# Patient Record
Sex: Female | Born: 1970 | Race: White | Hispanic: No | Marital: Married | Smoking: Never smoker
Health system: Southern US, Community
[De-identification: ages and names within clinical notes are randomized; demographics above are authoritative.]

## PROBLEM LIST (undated history)

## (undated) DIAGNOSIS — M5412 Radiculopathy, cervical region: Secondary | ICD-10-CM

## (undated) DIAGNOSIS — G2581 Restless legs syndrome: Secondary | ICD-10-CM

## (undated) DIAGNOSIS — R000 Tachycardia, unspecified: Secondary | ICD-10-CM

## (undated) DIAGNOSIS — F329 Major depressive disorder, single episode, unspecified: Secondary | ICD-10-CM

## (undated) DIAGNOSIS — F419 Anxiety disorder, unspecified: Secondary | ICD-10-CM

## (undated) DIAGNOSIS — G459 Transient cerebral ischemic attack, unspecified: Secondary | ICD-10-CM

## (undated) DIAGNOSIS — M545 Low back pain, unspecified: Secondary | ICD-10-CM

## (undated) DIAGNOSIS — M797 Fibromyalgia: Secondary | ICD-10-CM

## (undated) DIAGNOSIS — R5382 Chronic fatigue, unspecified: Secondary | ICD-10-CM

## (undated) DIAGNOSIS — I10 Essential (primary) hypertension: Secondary | ICD-10-CM

## (undated) DIAGNOSIS — F32A Depression, unspecified: Secondary | ICD-10-CM

## (undated) HISTORY — PX: TONSILLECTOMY: SUR1361

## (undated) HISTORY — DX: Low back pain, unspecified: M54.50

## (undated) HISTORY — DX: Chronic fatigue, unspecified: R53.82

## (undated) HISTORY — DX: Radiculopathy, cervical region: M54.12

## (undated) HISTORY — DX: Tachycardia, unspecified: R00.0

---

## 1999-12-25 HISTORY — PX: ABDOMINAL HYSTERECTOMY: SHX81

## 2004-01-25 ENCOUNTER — Other Ambulatory Visit: Admission: RE | Admit: 2004-01-25 | Discharge: 2004-01-25 | Payer: Self-pay | Admitting: Obstetrics and Gynecology

## 2008-08-02 ENCOUNTER — Encounter: Admission: RE | Admit: 2008-08-02 | Discharge: 2008-08-02 | Payer: Self-pay | Admitting: Gastroenterology

## 2008-09-05 ENCOUNTER — Emergency Department (HOSPITAL_COMMUNITY): Admission: EM | Admit: 2008-09-05 | Discharge: 2008-09-06 | Payer: Self-pay | Admitting: Emergency Medicine

## 2008-09-14 ENCOUNTER — Other Ambulatory Visit: Admission: RE | Admit: 2008-09-14 | Discharge: 2008-09-14 | Payer: Self-pay | Admitting: Family Medicine

## 2008-09-29 ENCOUNTER — Encounter: Admission: RE | Admit: 2008-09-29 | Discharge: 2008-09-29 | Payer: Self-pay | Admitting: Family Medicine

## 2009-08-01 ENCOUNTER — Encounter (HOSPITAL_COMMUNITY): Admission: RE | Admit: 2009-08-01 | Discharge: 2009-09-22 | Payer: Self-pay | Admitting: Family Medicine

## 2009-08-23 ENCOUNTER — Emergency Department (HOSPITAL_BASED_OUTPATIENT_CLINIC_OR_DEPARTMENT_OTHER): Admission: EM | Admit: 2009-08-23 | Discharge: 2009-08-24 | Payer: Self-pay | Admitting: Emergency Medicine

## 2009-08-24 ENCOUNTER — Ambulatory Visit: Payer: Self-pay | Admitting: Interventional Radiology

## 2010-01-09 ENCOUNTER — Encounter: Admission: RE | Admit: 2010-01-09 | Discharge: 2010-01-09 | Payer: Self-pay | Admitting: Family Medicine

## 2010-03-04 IMAGING — CT CT ABDOMEN W/ CM
2 of 5 series · 14 of 32 positions shown, 19 images · IV contrast (omniscan)
Comparison: Abdominal radiographs 09/06/2008

CT ABDOMEN

CLINICAL DATA: Abdominal pain and vomiting

CT ABDOMEN AND PELVIS WITH CONTRAST
TECHNIQUE: Multidetector CT imaging of the abdomen and pelvis was
performed using the standard protocol following bolus
administration of intravenous contrast.
Contrast: 100 ml Omniscan 300 IV contrast

[Series 2: routine abdomen · axial · 0.70mm/px · z∈[-431,-126]mm · 7 of 83 slices shown, 12 images]
[im 11/83  soft-tissue]
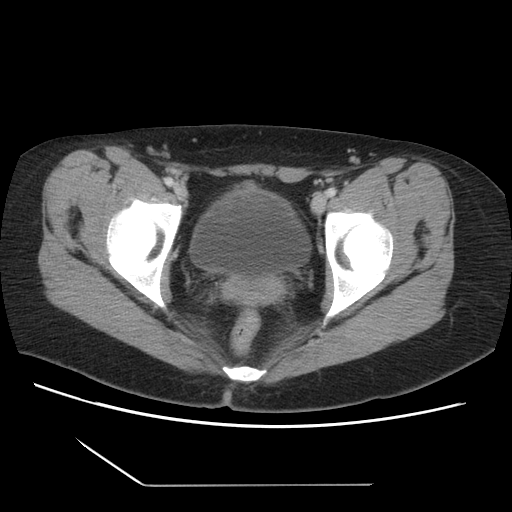
[im 11/83  bone]
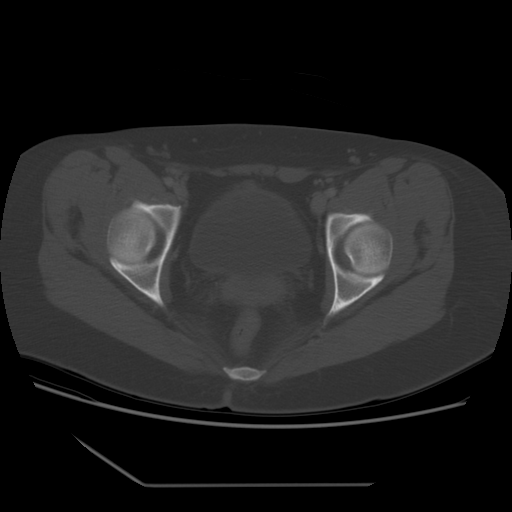
[im 21/83  soft-tissue]
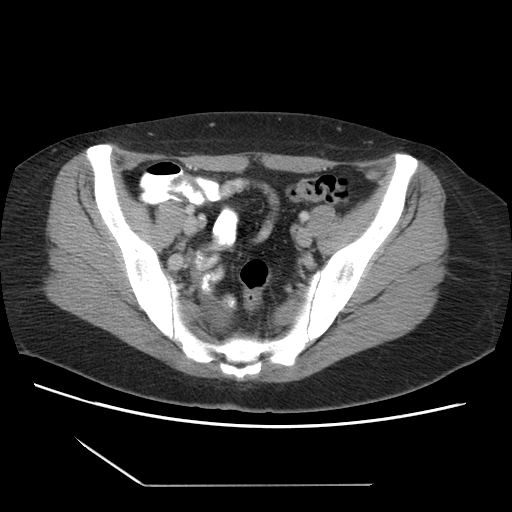
[im 31/83  soft-tissue]
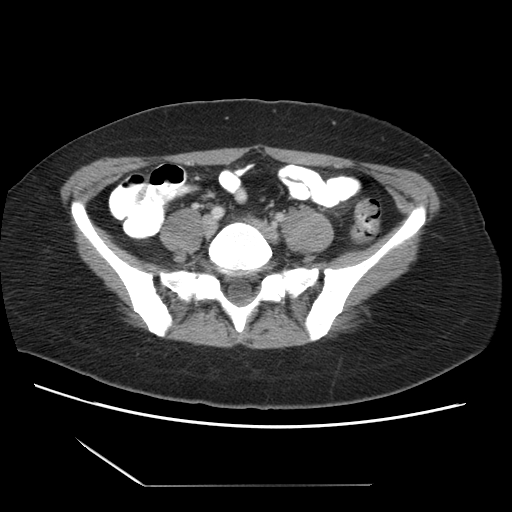
[im 42/83  soft-tissue]
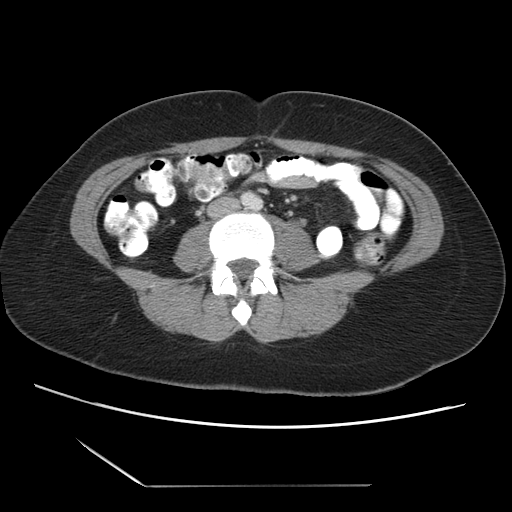
[im 42/83  lung]
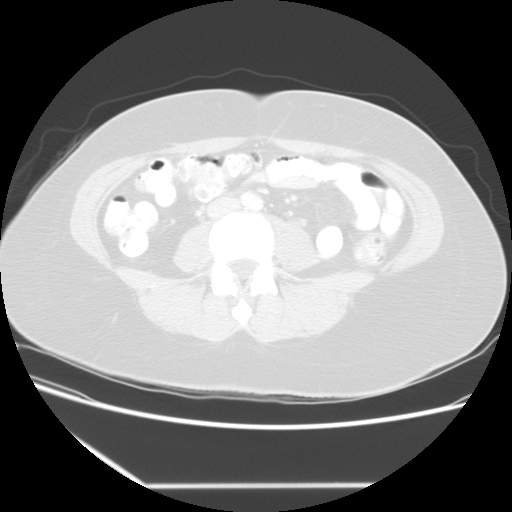
[im 52/83  soft-tissue]
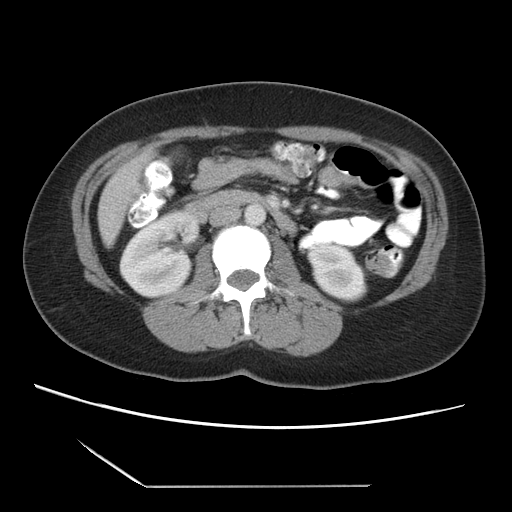
[im 52/83  lung]
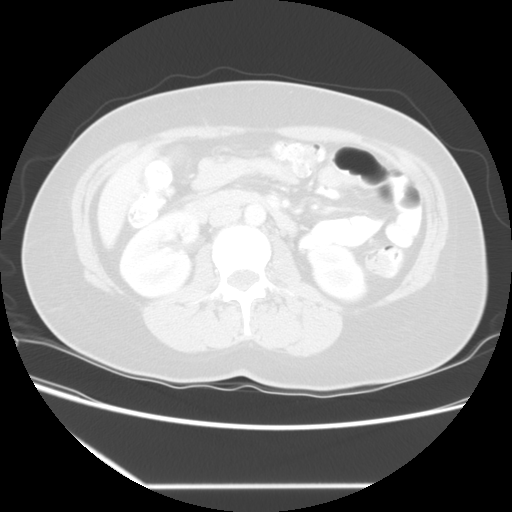
[im 62/83  soft-tissue]
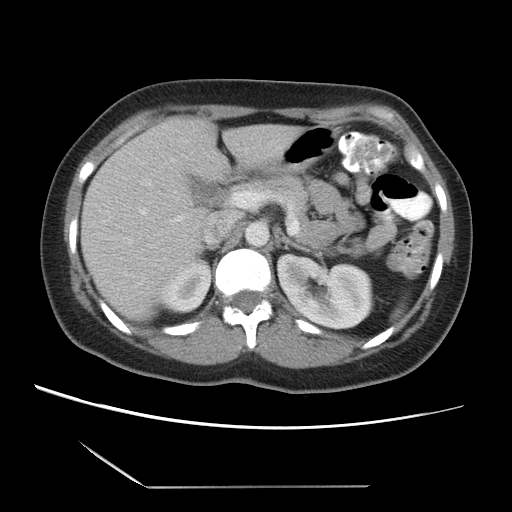
[im 62/83  lung]
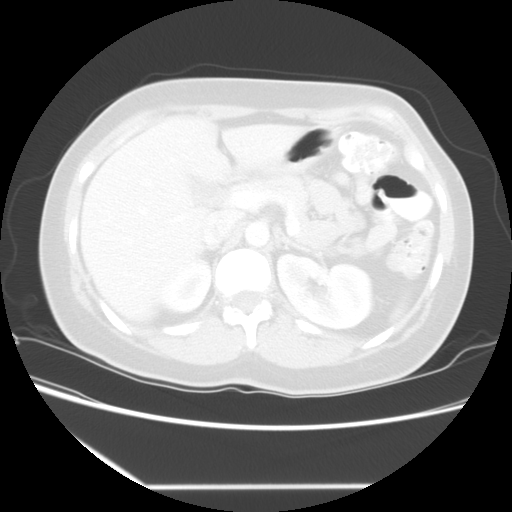
[im 72/83  soft-tissue]
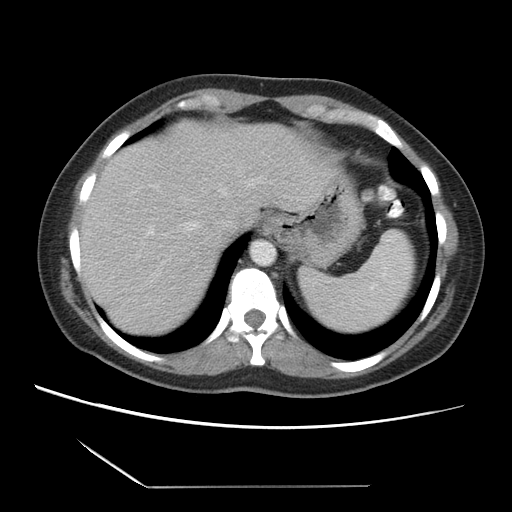
[im 72/83  lung]
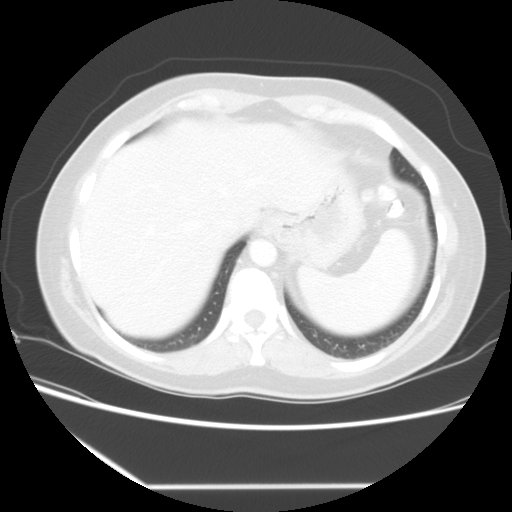

[Series 400: reformatted · sagittal · 0.88mm/px · 7 of 115 slices shown]
[im 12/115  soft-tissue]
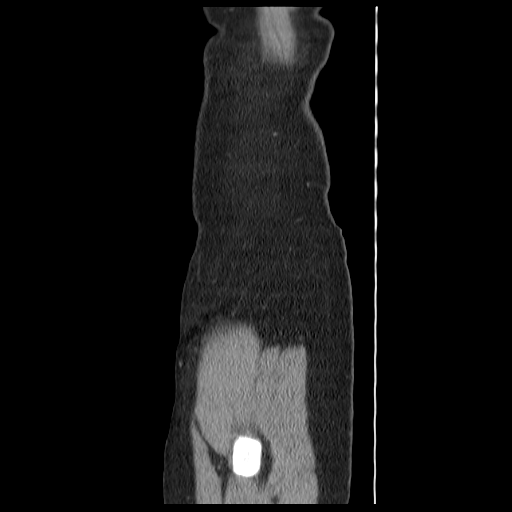
[im 23/115  soft-tissue]
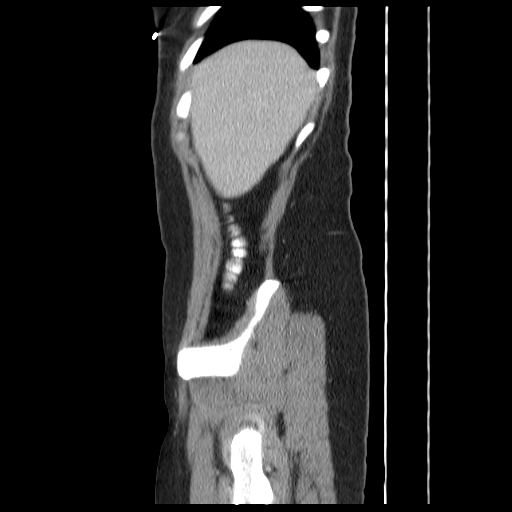
[im 35/115  soft-tissue]
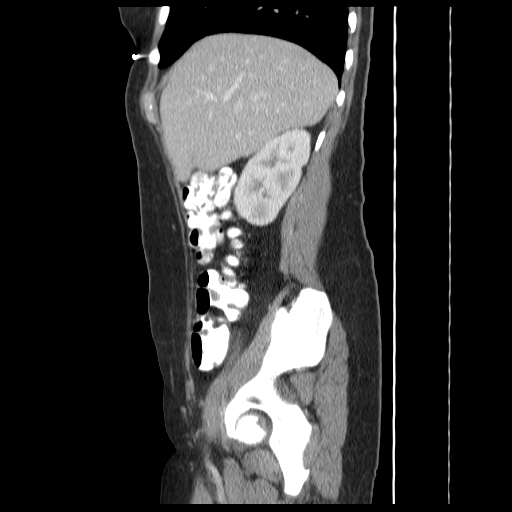
[im 46/115  soft-tissue]
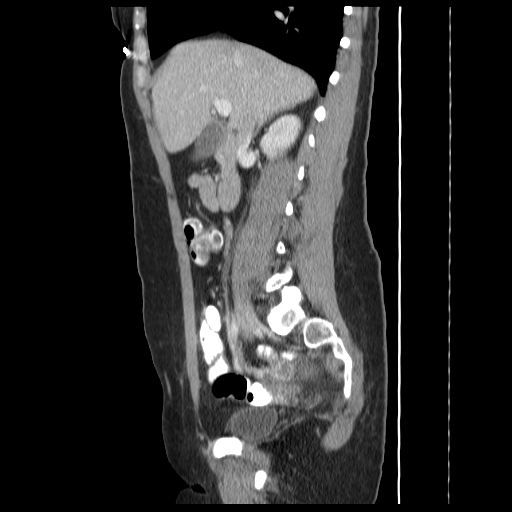
[im 69/115  soft-tissue]
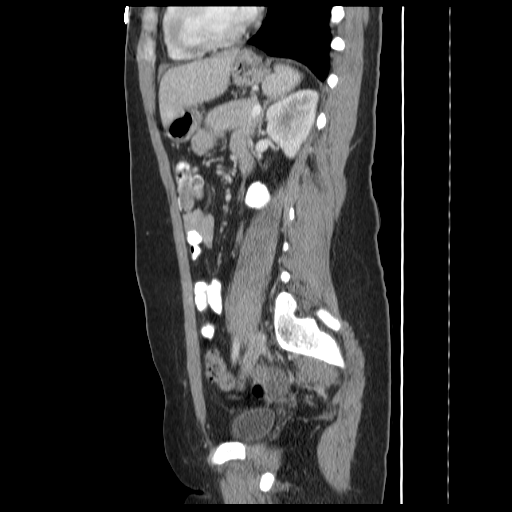
[im 80/115  soft-tissue]
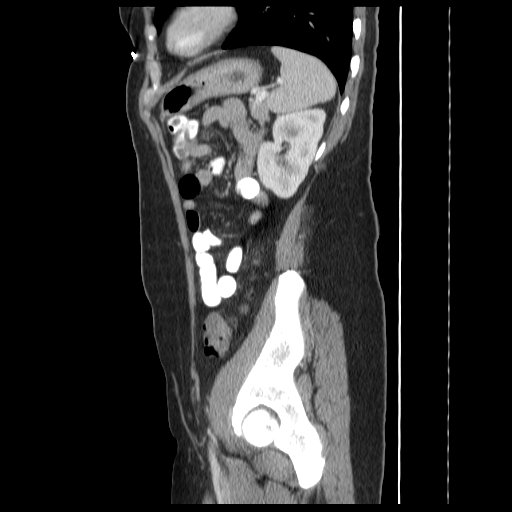
[im 92/115  soft-tissue]
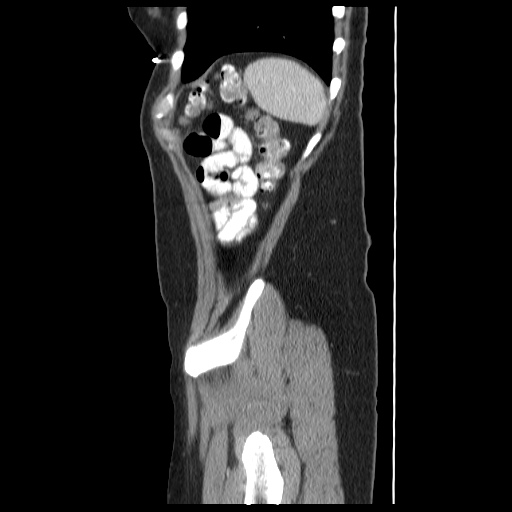

[14 of 32 positions shown; findings below may reference images not displayed]

FINDINGS: Lung bases are clear.  Abdominal viscera are normal.  No
free air or fluid.
IMPRESSION: No acute intra-abdominal finding.

CT PELVIS
FINDINGS: A small amount of low density free pelvic fluid is
identified.  The ovaries are unremarkable.  The uterus appears to
be surgically absent.  Some reflux of contrast into the right
gonadal vein is noted which may suggest pelvic congestion syndrome
in the appropriate clinical context.  Small and large bowel are
normal in appearance. No acute bony finding.
IMPRESSION: No acute intrapelvic finding.

## 2010-03-04 IMAGING — CR DG ABDOMEN ACUTE W/ 1V CHEST
3 series · 3 of 3 positions shown · non-contrast
Comparison: 08/02/2008

CLINICAL DATA: Pain.

ACUTE ABDOMEN SERIES (ABDOMEN 2 VIEW & CHEST 1 VIEW)

[w chest pa]
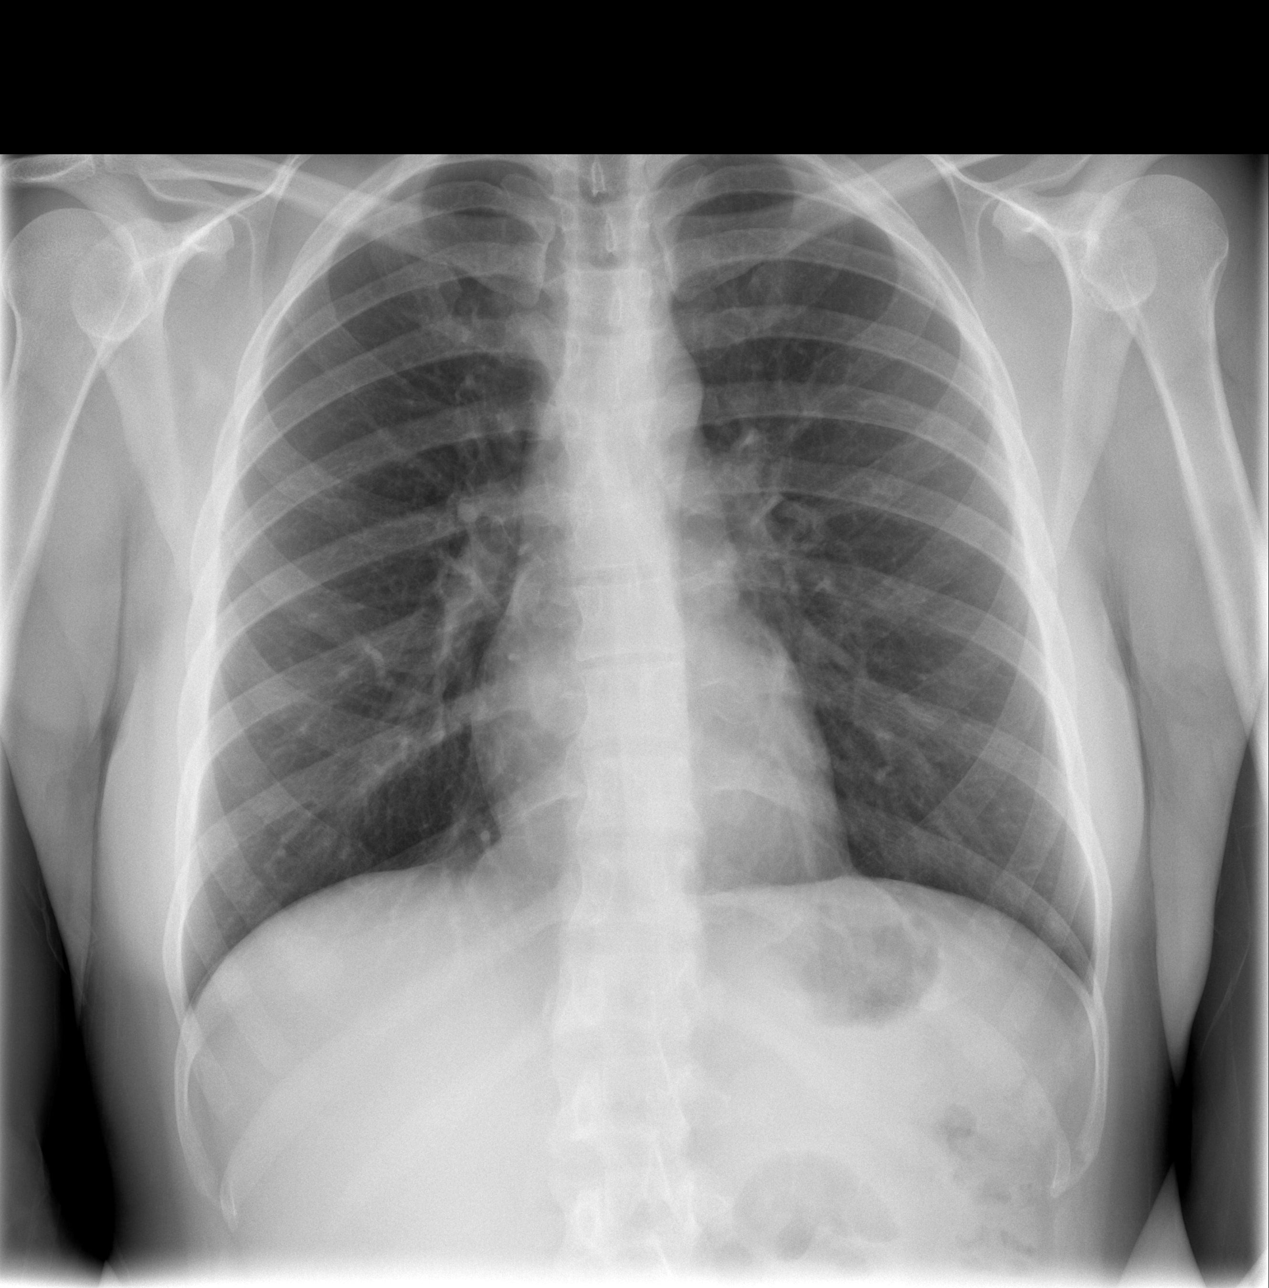

[w abdomen upright]
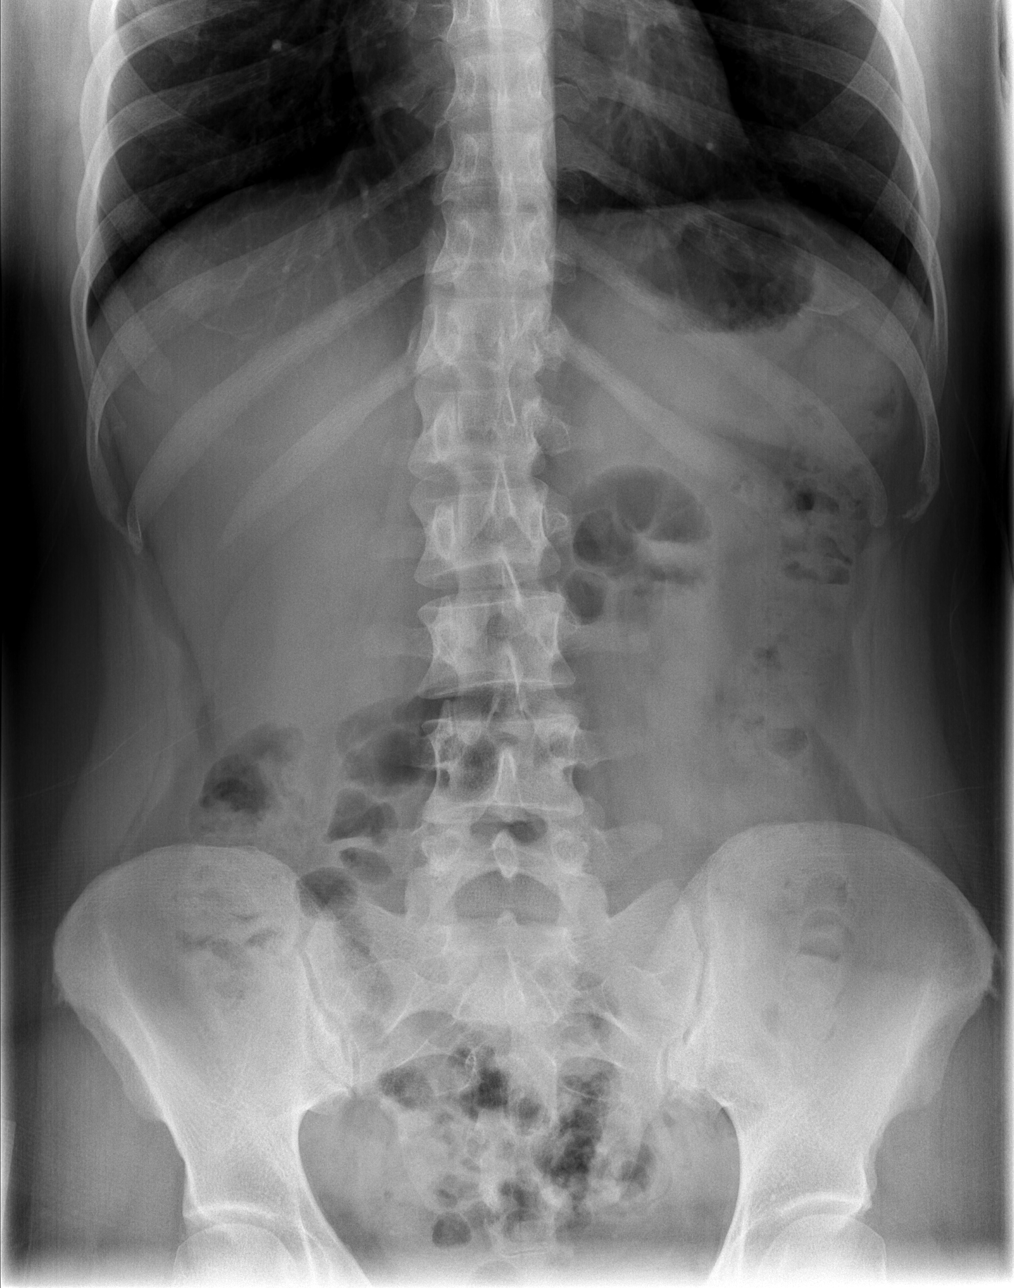

[t abdomen supine]
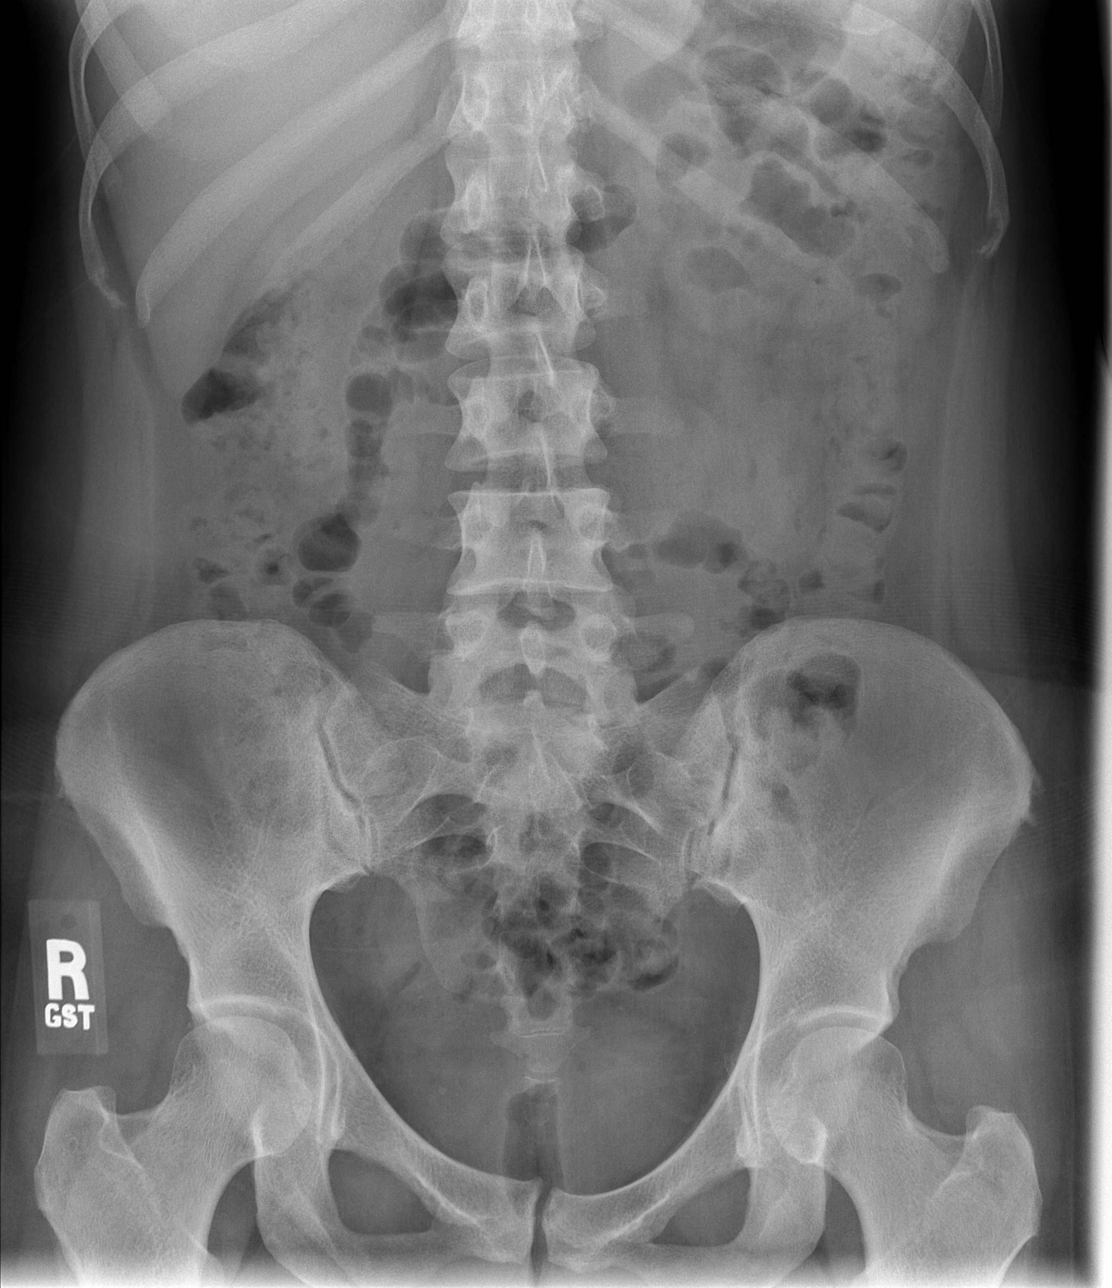

[3 of 3 positions shown; findings below may reference images not displayed]

FINDINGS: Heart size and mediastinal contours are unremarkable.

No pleural effusion or pulmonary interstitial edema.

No airspace densities identified.

The bowel gas pattern is nonobstructive.

 No dilated loops of small bowel or air-fluid levels noted.
IMPRESSION: 1.  Nonobstructive bowel gas pattern.

## 2011-03-30 LAB — POCT B-TYPE NATRIURETIC PEPTIDE (BNP): B Natriuretic Peptide, POC: <5 pg/mL (ref 0–100)

## 2011-03-31 LAB — URINALYSIS, ROUTINE W REFLEX MICROSCOPIC
Glucose, UA: NEGATIVE mg/dL
Ketones, ur: NEGATIVE mg/dL
Specific Gravity, Urine: 1.015 (ref 1.005–1.030)
pH: 6 (ref 5.0–8.0)

## 2011-03-31 LAB — COMPREHENSIVE METABOLIC PANEL
AST: 46 U/L — ABNORMAL HIGH (ref 0–37)
Albumin: 4.2 g/dL (ref 3.5–5.2)
CO2: 23 mEq/L (ref 19–32)
GFR calc Af Amer: >60 mL/min (ref >60)
Glucose, Bld: 88 mg/dL (ref 70–99)
Potassium: 4.5 mEq/L (ref 3.5–5.1)
Total Protein: 7.7 g/dL (ref 6.0–8.3)

## 2011-04-19 ENCOUNTER — Other Ambulatory Visit: Payer: Self-pay | Admitting: Family Medicine

## 2011-04-19 ENCOUNTER — Ambulatory Visit
Admission: RE | Admit: 2011-04-19 | Discharge: 2011-04-19 | Disposition: A | Discharge status: Home / Self Care | Payer: Managed Care, Other (non HMO) | Source: Ambulatory Visit | Attending: Family Medicine | Admitting: Family Medicine

## 2011-04-19 DIAGNOSIS — R51 Headache: Secondary | ICD-10-CM

## 2011-04-19 DIAGNOSIS — R479 Unspecified speech disturbances: Secondary | ICD-10-CM

## 2011-04-20 ENCOUNTER — Emergency Department (HOSPITAL_BASED_OUTPATIENT_CLINIC_OR_DEPARTMENT_OTHER)
Admission: EM | Admit: 2011-04-20 | Discharge: 2011-04-20 | Disposition: A | Discharge status: Nursing Home (Includes ICF) | Payer: Managed Care, Other (non HMO) | Attending: Emergency Medicine | Admitting: Emergency Medicine

## 2011-04-20 ENCOUNTER — Emergency Department (HOSPITAL_COMMUNITY): Payer: Managed Care, Other (non HMO)

## 2011-04-20 ENCOUNTER — Emergency Department (HOSPITAL_COMMUNITY)
Admission: EM | Admit: 2011-04-20 | Discharge: 2011-04-20 | Disposition: A | Discharge status: Home / Self Care | Payer: Managed Care, Other (non HMO) | Attending: Emergency Medicine | Admitting: Emergency Medicine

## 2011-04-20 DIAGNOSIS — G459 Transient cerebral ischemic attack, unspecified: Secondary | ICD-10-CM | POA: Insufficient documentation

## 2011-04-20 DIAGNOSIS — I10 Essential (primary) hypertension: Secondary | ICD-10-CM | POA: Insufficient documentation

## 2011-04-20 DIAGNOSIS — Z79899 Other long term (current) drug therapy: Secondary | ICD-10-CM | POA: Insufficient documentation

## 2011-04-20 DIAGNOSIS — R4789 Other speech disturbances: Secondary | ICD-10-CM | POA: Insufficient documentation

## 2011-04-20 LAB — LIPID PANEL
Cholesterol: 157 mg/dL (ref 0–200)
HDL: 71 mg/dL (ref >39)
Total CHOL/HDL Ratio: 2.2 RATIO

## 2011-04-20 LAB — URINALYSIS, ROUTINE W REFLEX MICROSCOPIC
Bilirubin Urine: NEGATIVE
Hgb urine dipstick: NEGATIVE
Ketones, ur: NEGATIVE mg/dL
Protein, ur: NEGATIVE mg/dL
Urobilinogen, UA: 0.2 mg/dL (ref 0.0–1.0)
pH: 6.5 (ref 5.0–8.0)

## 2011-04-20 LAB — BASIC METABOLIC PANEL
CO2: 28 mEq/L (ref 19–32)
Creatinine, Ser: 0.6 mg/dL (ref 0.4–1.2)
GFR calc non Af Amer: >60 mL/min (ref >60)
Glucose, Bld: 88 mg/dL (ref 70–99)

## 2011-04-20 LAB — CBC
HCT: 37 % (ref 36.0–46.0)
Platelets: 309 10*3/uL (ref 150–400)
RBC: 4.16 MIL/uL (ref 3.87–5.11)
RDW: 12.5 % (ref 11.5–15.5)

## 2011-04-20 LAB — TROPONIN I: Troponin I: 0.01 ng/mL (ref 0.00–0.06)

## 2011-04-20 LAB — COMPREHENSIVE METABOLIC PANEL
ALT: 18 U/L (ref 0–35)
AST: 20 U/L (ref 0–37)
Alkaline Phosphatase: 78 U/L (ref 39–117)
BUN: 8 mg/dL (ref 6–23)
CO2: 27 mEq/L (ref 19–32)
GFR calc non Af Amer: >60 mL/min (ref >60)
Glucose, Bld: 104 mg/dL — ABNORMAL HIGH (ref 70–99)
Potassium: 4 mEq/L (ref 3.5–5.1)
Total Bilirubin: 0.2 mg/dL — ABNORMAL LOW (ref 0.3–1.2)

## 2011-04-20 LAB — DIFFERENTIAL
Eosinophils Relative: 1 % (ref 0–5)
Monocytes Absolute: 0.7 10*3/uL (ref 0.1–1.0)
Monocytes Relative: 9 % (ref 3–12)
Neutro Abs: 4.9 10*3/uL (ref 1.7–7.7)

## 2011-04-20 LAB — POCT CARDIAC MARKERS: Myoglobin, poc: 41.6 ng/mL (ref 12–200)

## 2011-04-20 LAB — PREGNANCY, URINE: Preg Test, Ur: NEGATIVE

## 2011-04-20 LAB — HEMOGLOBIN A1C: Mean Plasma Glucose: 97 mg/dL (ref <117)

## 2011-04-20 LAB — PROTIME-INR: INR: 0.88 (ref 0.00–1.49)

## 2011-05-23 ENCOUNTER — Other Ambulatory Visit: Payer: Self-pay | Admitting: Family Medicine

## 2011-05-23 ENCOUNTER — Ambulatory Visit
Admission: RE | Admit: 2011-05-23 | Discharge: 2011-05-23 | Disposition: A | Discharge status: Home / Self Care | Payer: Managed Care, Other (non HMO) | Source: Ambulatory Visit | Attending: Family Medicine | Admitting: Family Medicine

## 2011-05-23 DIAGNOSIS — R0602 Shortness of breath: Secondary | ICD-10-CM

## 2011-05-23 MED ORDER — IOHEXOL 350 MG/ML SOLN
125.0000 mL | Freq: Once | INTRAVENOUS | Status: AC | PRN
  Administered 2011-05-23: 125 mL via INTRAVENOUS

## 2011-05-24 ENCOUNTER — Ambulatory Visit (HOSPITAL_COMMUNITY)
Admission: RE | Admit: 2011-05-24 | Discharge: 2011-05-24 | Disposition: A | Discharge status: Home / Self Care | Payer: Managed Care, Other (non HMO) | Source: Ambulatory Visit | Attending: Cardiology | Admitting: Cardiology

## 2011-05-24 DIAGNOSIS — G459 Transient cerebral ischemic attack, unspecified: Secondary | ICD-10-CM | POA: Insufficient documentation

## 2011-06-12 ENCOUNTER — Other Ambulatory Visit: Payer: Self-pay | Admitting: Family Medicine

## 2011-06-12 DIAGNOSIS — K219 Gastro-esophageal reflux disease without esophagitis: Secondary | ICD-10-CM

## 2011-06-19 ENCOUNTER — Ambulatory Visit
Admission: RE | Admit: 2011-06-19 | Discharge: 2011-06-19 | Disposition: A | Discharge status: Home / Self Care | Payer: Managed Care, Other (non HMO) | Source: Ambulatory Visit | Attending: Family Medicine | Admitting: Family Medicine

## 2011-06-19 DIAGNOSIS — K219 Gastro-esophageal reflux disease without esophagitis: Secondary | ICD-10-CM

## 2011-07-04 ENCOUNTER — Emergency Department (HOSPITAL_COMMUNITY)
Admission: EM | Admit: 2011-07-04 | Discharge: 2011-07-04 | Discharge status: Against Medical Advice | Payer: Managed Care, Other (non HMO) | Attending: Emergency Medicine | Admitting: Emergency Medicine

## 2011-07-04 DIAGNOSIS — R209 Unspecified disturbances of skin sensation: Secondary | ICD-10-CM | POA: Insufficient documentation

## 2011-07-04 DIAGNOSIS — R51 Headache: Secondary | ICD-10-CM | POA: Insufficient documentation

## 2011-09-05 ENCOUNTER — Other Ambulatory Visit: Payer: Self-pay | Admitting: Family Medicine

## 2011-09-05 ENCOUNTER — Other Ambulatory Visit (HOSPITAL_BASED_OUTPATIENT_CLINIC_OR_DEPARTMENT_OTHER): Payer: Self-pay | Admitting: Family Medicine

## 2011-09-05 DIAGNOSIS — G459 Transient cerebral ischemic attack, unspecified: Secondary | ICD-10-CM

## 2011-09-08 ENCOUNTER — Ambulatory Visit
Admission: RE | Admit: 2011-09-08 | Discharge: 2011-09-08 | Disposition: A | Discharge status: Home / Self Care | Payer: Managed Care, Other (non HMO) | Source: Ambulatory Visit | Attending: Family Medicine | Admitting: Family Medicine

## 2011-09-08 DIAGNOSIS — G459 Transient cerebral ischemic attack, unspecified: Secondary | ICD-10-CM

## 2011-09-08 MED ORDER — GADOBENATE DIMEGLUMINE 529 MG/ML IV SOLN
15.0000 mL | Freq: Once | INTRAVENOUS | Status: AC | PRN
  Administered 2011-09-08: 10 mL via INTRAVENOUS

## 2011-09-17 ENCOUNTER — Emergency Department (HOSPITAL_COMMUNITY): Payer: Managed Care, Other (non HMO)

## 2011-09-17 ENCOUNTER — Observation Stay (HOSPITAL_COMMUNITY)
Admission: EM | Admit: 2011-09-17 | Discharge: 2011-09-18 | Disposition: A | Discharge status: Home / Self Care | Payer: Managed Care, Other (non HMO) | Source: Ambulatory Visit | Attending: Emergency Medicine | Admitting: Emergency Medicine

## 2011-09-17 DIAGNOSIS — H538 Other visual disturbances: Secondary | ICD-10-CM | POA: Insufficient documentation

## 2011-09-17 DIAGNOSIS — Z8673 Personal history of transient ischemic attack (TIA), and cerebral infarction without residual deficits: Secondary | ICD-10-CM | POA: Insufficient documentation

## 2011-09-17 DIAGNOSIS — G43109 Migraine with aura, not intractable, without status migrainosus: Principal | ICD-10-CM | POA: Insufficient documentation

## 2011-09-17 HISTORY — DX: Essential (primary) hypertension: I10

## 2011-09-17 HISTORY — DX: Transient cerebral ischemic attack, unspecified: G45.9

## 2011-09-17 LAB — CBC
MCH: 31.2 pg (ref 26.0–34.0)
MCHC: 35.3 g/dL (ref 30.0–36.0)
Platelets: 306 10*3/uL (ref 150–400)
RDW: 12.1 % (ref 11.5–15.5)

## 2011-09-17 LAB — DIFFERENTIAL
Basophils Absolute: 0 10*3/uL (ref 0.0–0.1)
Basophils Relative: 0 % (ref 0–1)
Eosinophils Absolute: 0.2 10*3/uL (ref 0.0–0.7)
Eosinophils Relative: 2 % (ref 0–5)
Lymphs Abs: 2.9 10*3/uL (ref 0.7–4.0)
Monocytes Absolute: 0.7 10*3/uL (ref 0.1–1.0)
Monocytes Relative: 8 % (ref 3–12)
Neutro Abs: 5 10*3/uL (ref 1.7–7.7)
Neutrophils Relative %: 57 % (ref 43–77)
nRBC: 0 /100 WBC

## 2011-09-17 LAB — POCT I-STAT TROPONIN I: Troponin i, poc: 0 ng/mL (ref 0.00–0.08)

## 2011-09-18 ENCOUNTER — Encounter (HOSPITAL_COMMUNITY): Payer: Self-pay | Admitting: Radiology

## 2011-09-18 ENCOUNTER — Observation Stay (HOSPITAL_COMMUNITY): Payer: Managed Care, Other (non HMO)

## 2011-09-18 LAB — URINALYSIS, ROUTINE W REFLEX MICROSCOPIC
Bilirubin Urine: NEGATIVE
Hgb urine dipstick: NEGATIVE
Protein, ur: NEGATIVE mg/dL
Urobilinogen, UA: 0.2 mg/dL (ref 0.0–1.0)

## 2011-09-18 LAB — LIPID PANEL
Cholesterol: 164 mg/dL (ref 0–200)
HDL: 62 mg/dL (ref >39)
Total CHOL/HDL Ratio: 2.6 RATIO
Triglycerides: 45 mg/dL (ref <150)
VLDL: 9 mg/dL (ref 0–40)

## 2011-09-18 LAB — COMPREHENSIVE METABOLIC PANEL
Albumin: 3.8 g/dL (ref 3.5–5.2)
BUN: 8 mg/dL (ref 6–23)
Calcium: 9 mg/dL (ref 8.4–10.5)
Chloride: 104 mEq/L (ref 96–112)
Creatinine, Ser: 0.62 mg/dL (ref 0.50–1.10)
Total Bilirubin: 0.6 mg/dL (ref 0.3–1.2)

## 2011-09-18 LAB — GLUCOSE, CAPILLARY: Glucose-Capillary: 63 mg/dL — ABNORMAL LOW (ref 70–99)

## 2011-09-18 LAB — CK TOTAL AND CKMB (NOT AT ARMC)
CK, MB: 2.7 ng/mL (ref 0.3–4.0)
Relative Index: 2.3 (ref 0.0–2.5)

## 2011-09-18 LAB — PROTIME-INR: Prothrombin Time: 13.6 seconds (ref 11.6–15.2)

## 2011-09-18 MED ORDER — GADOBENATE DIMEGLUMINE 529 MG/ML IV SOLN
15.0000 mL | Freq: Once | INTRAVENOUS | Status: AC
  Administered 2011-09-18: 15 mL via INTRAVENOUS

## 2011-09-26 LAB — OCCULT BLOOD X 1 CARD TO LAB, STOOL: Fecal Occult Bld: POSITIVE

## 2011-09-26 LAB — URINALYSIS, ROUTINE W REFLEX MICROSCOPIC
Bilirubin Urine: NEGATIVE
Glucose, UA: NEGATIVE
Hgb urine dipstick: NEGATIVE
Ketones, ur: 15 — AB
Nitrite: NEGATIVE
Protein, ur: NEGATIVE
Specific Gravity, Urine: 1.015
Urobilinogen, UA: 0.2
pH: 6.5

## 2011-09-26 LAB — DIFFERENTIAL
Basophils Absolute: 0
Basophils Relative: 0
Eosinophils Absolute: 0
Eosinophils Relative: 0
Lymphocytes Relative: 24
Lymphs Abs: 2.3
Monocytes Absolute: 0.7
Monocytes Relative: 7
Neutro Abs: 6.6
Neutrophils Relative %: 68

## 2011-09-26 LAB — CBC
HCT: 37.3
Hemoglobin: 13.1
MCHC: 35
MCV: 93.2
Platelets: 330
RBC: 4
RDW: 12.4
WBC: 9.7

## 2011-09-26 LAB — COMPREHENSIVE METABOLIC PANEL WITH GFR
Albumin: 4.1
CO2: 23
Chloride: 106
Creatinine, Ser: 0.56
GFR calc Af Amer: >60
Potassium: 3.7
Sodium: 136
Total Bilirubin: 1
Total Protein: 7.5

## 2011-09-26 LAB — COMPREHENSIVE METABOLIC PANEL
ALT: 12
AST: 15
Alkaline Phosphatase: 79
BUN: 8
Calcium: 8.9
GFR calc non Af Amer: >60
Glucose, Bld: 92

## 2011-09-26 LAB — PREGNANCY, URINE: Preg Test, Ur: NEGATIVE

## 2011-09-26 LAB — LIPASE, BLOOD: Lipase: 22

## 2012-01-27 ENCOUNTER — Emergency Department (HOSPITAL_BASED_OUTPATIENT_CLINIC_OR_DEPARTMENT_OTHER)
Admission: EM | Admit: 2012-01-27 | Discharge: 2012-01-27 | Disposition: A | Discharge status: Home / Self Care | Payer: Managed Care, Other (non HMO) | Attending: Emergency Medicine | Admitting: Emergency Medicine

## 2012-01-27 ENCOUNTER — Encounter (HOSPITAL_BASED_OUTPATIENT_CLINIC_OR_DEPARTMENT_OTHER): Payer: Self-pay | Admitting: *Deleted

## 2012-01-27 DIAGNOSIS — R51 Headache: Secondary | ICD-10-CM | POA: Insufficient documentation

## 2012-01-27 DIAGNOSIS — G8929 Other chronic pain: Secondary | ICD-10-CM

## 2012-01-27 DIAGNOSIS — Z79899 Other long term (current) drug therapy: Secondary | ICD-10-CM | POA: Insufficient documentation

## 2012-01-27 DIAGNOSIS — M542 Cervicalgia: Secondary | ICD-10-CM | POA: Insufficient documentation

## 2012-01-27 DIAGNOSIS — I10 Essential (primary) hypertension: Secondary | ICD-10-CM | POA: Insufficient documentation

## 2012-01-27 DIAGNOSIS — Z8679 Personal history of other diseases of the circulatory system: Secondary | ICD-10-CM | POA: Insufficient documentation

## 2012-01-27 DIAGNOSIS — R1013 Epigastric pain: Secondary | ICD-10-CM | POA: Insufficient documentation

## 2012-01-27 MED ORDER — MORPHINE SULFATE 10 MG/ML IJ SOLN
10.0000 mg | Freq: Once | INTRAMUSCULAR | Status: AC
  Administered 2012-01-27: 10 mg via INTRAMUSCULAR
  Filled 2012-01-27: qty 1

## 2012-01-27 NOTE — ED Notes (Signed)
Pt with clear speech no facial drooping answers questions appropriately pupils equal and reactive. Pt states that her speech was slurred and her BP was 142/112 PTA

## 2012-01-27 NOTE — ED Provider Notes (Signed)
History     CSN: [id number]  Arrival date & time 01/27/12  4098   First MD Initiated Contact with Patient 01/27/12 0406      Chief Complaint  Patient presents with  . Muscle Pain    (Consider location/radiation/quality/duration/timing/severity/associated sxs/prior treatment) HPI This 41 year old female has had chronic severe intolerable pain 24 hours a day for years which is gradually worsening over the last several months despite multiple different narcotics, she states Percocet use to work but she was told she can take that anymore because it might cause the TIAs, she most recently switched to Opana but states she is on a low dose and it is not working for her pain, she has no trauma no fever no confusion no change in speech vision swallowing or understanding and no new localized weakness numbness or paresthesias or incoordination but she wants a pain shot of morphine so that she can call her doctor later today when the clinic opens to discuss her pain management again. She has an appointment tomorrow with her doctor but states that her doctor also has a clinic open on Sundays because he is with [facility]. Her husband is here to drive her home. The patient is a bit of an unusual historian in fact she told me her husband was out of town and was in an airplane at this time and his plane had not planned to get the report and yet within 30 seconds her husband walked into the room and the patient had a surprised to her face while her husband acted as if nothing was unusual and told me his plane had arrived a couple hours earlier. The patient would like to be on a continuous morphine IV infusion and states her pain is such that every part of her body hurts severely continuously including her toenails. She describes pain in her head neck back chest abdomen arms and legs all of which is present 24 hours a day for years. Past Medical History  Diagnosis Date  . TIA (transient ischemic attack)       pt states 2 since 4/12  . Hypertension     Past Surgical History  Procedure Date  . Abdominal hysterectomy     History reviewed. No pertinent family history.  History  Substance Use Topics  . Smoking status: Never Smoker   . Smokeless tobacco: Not on file  . Alcohol Use: No    OB History    Grav Para Term Preterm Abortions TAB SAB Ect Mult Living                  Review of Systems  Constitutional: Negative for fever.       10  Systems reviewed and are negative for acute change except as noted in the HPI.  HENT: Positive for neck pain. Negative for congestion.   Eyes: Negative for discharge and redness.  Respiratory: Negative for cough and shortness of breath.   Cardiovascular: Positive for chest pain.  Gastrointestinal: Positive for abdominal pain. Negative for vomiting.  Musculoskeletal: Positive for myalgias, back pain and arthralgias. Negative for joint swelling.  Skin: Negative for rash.  Neurological: Positive for headaches. Negative for syncope, facial asymmetry, weakness and numbness.  Psychiatric/Behavioral: Negative for suicidal ideas and hallucinations.       No behavior change.    Allergies  Methadone and Other  Home Medications   Current Outpatient Rx  Name Route Sig Dispense Refill  . ALPRAZOLAM 1 MG PO TABS Oral Take 1  mg by mouth at bedtime as needed.    . BUPROPION HCL 100 MG PO TABS Oral Take 100 mg by mouth 2 (two) times daily.    . DESVENLAFAXINE SUCCINATE ER 100 MG PO TB24 Oral Take 100 mg by mouth daily.    Marland Kitchen LISDEXAMFETAMINE DIMESYLATE 20 MG PO CAPS Oral Take 20 mg by mouth every morning.    Marland Kitchen METOPROLOL TARTRATE 100 MG PO TABS Oral Take 100 mg by mouth 2 (two) times daily.    Marland Kitchen OXYMORPHONE HCL 10 MG PO TABS Oral Take 10 mg by mouth every 4 (four) hours as needed.      BP 131/94  Pulse 101  Temp(Src) 97.8 F (36.6 C) (Oral)  Resp 18  Ht 5\' 6"  (1.676 m)  Wt 148 lb (67.132 kg)  BMI 23.89 kg/m2  SpO2 100%  Physical Exam  Nursing  note and vitals reviewed. Constitutional:       Awake, alert, nontoxic appearance with baseline speech for patient.  HENT:  Head: Atraumatic.  Mouth/Throat: No oropharyngeal exudate.  Eyes: EOM are normal. Pupils are equal, round, and reactive to light. Right eye exhibits no discharge. Left eye exhibits no discharge.  Neck: Neck supple.       Posterior neck is diffusely tender without swelling or deformity noted  Cardiovascular: Normal rate and regular rhythm.   No murmur heard. Pulmonary/Chest: Effort normal and breath sounds normal. No stridor. No respiratory distress. She has no wheezes. She has no rales. She exhibits tenderness.       Entire chest wall shows reproducible tenderness  Abdominal: Soft. Bowel sounds are normal. She exhibits no mass. There is tenderness. There is no rebound.       Entire abdomen shows mild diffuse tenderness without rebound  Musculoskeletal: She exhibits tenderness. She exhibits no edema.       Baseline ROM, moves extremities with no obvious new focal weakness. Her entire back and all 4 extremities are diffusely tender without erythema swelling or deformity noted including capillary refill less than 2 seconds  Lymphadenopathy:    She has no cervical adenopathy.  Neurological: She is alert.       Awake, alert, cooperative and aware of situation; motor strength bilaterally; sensation normal to light touch bilaterally; peripheral visual fields full to confrontation; no facial asymmetry; tongue midline; major cranial nerves appear intact; no pronator drift, normal finger to nose bilaterally  Skin: No rash noted.  Psychiatric:       Anxious    ED Course  Procedures (including critical care time)  Labs Reviewed - No data to display No results found.   1. Chronic pain of multiple sites       MDM  I doubt any other EMC precluding discharge at this time including, but not necessarily limited to the following:SBI.        Hurman Horn,  MD 01/28/12 2149

## 2012-01-27 NOTE — ED Notes (Signed)
Pt states that she has real bad fibromyalgia and her PCP has been trying new meds she has an appointment on Monday but cant wait states that the pain is so severe she can not sleep. Pt states that she has had four TIA's since April and she is concerned that her pain will cause her to have another TIA because her left eye was drooping before she came in pt also with CP.

## 2013-03-17 ENCOUNTER — Encounter (HOSPITAL_COMMUNITY): Payer: Self-pay | Admitting: *Deleted

## 2013-03-17 ENCOUNTER — Emergency Department (HOSPITAL_COMMUNITY)
Admission: EM | Admit: 2013-03-17 | Discharge: 2013-03-17 | Disposition: A | Discharge status: Home / Self Care | Payer: Managed Care, Other (non HMO) | Attending: Emergency Medicine | Admitting: Emergency Medicine

## 2013-03-17 ENCOUNTER — Emergency Department (HOSPITAL_COMMUNITY): Payer: Managed Care, Other (non HMO)

## 2013-03-17 DIAGNOSIS — R0602 Shortness of breath: Secondary | ICD-10-CM | POA: Insufficient documentation

## 2013-03-17 DIAGNOSIS — I10 Essential (primary) hypertension: Secondary | ICD-10-CM | POA: Insufficient documentation

## 2013-03-17 DIAGNOSIS — Z79899 Other long term (current) drug therapy: Secondary | ICD-10-CM | POA: Insufficient documentation

## 2013-03-17 DIAGNOSIS — R0789 Other chest pain: Secondary | ICD-10-CM | POA: Insufficient documentation

## 2013-03-17 DIAGNOSIS — M7989 Other specified soft tissue disorders: Secondary | ICD-10-CM | POA: Insufficient documentation

## 2013-03-17 DIAGNOSIS — Z8739 Personal history of other diseases of the musculoskeletal system and connective tissue: Secondary | ICD-10-CM | POA: Insufficient documentation

## 2013-03-17 DIAGNOSIS — F411 Generalized anxiety disorder: Secondary | ICD-10-CM | POA: Insufficient documentation

## 2013-03-17 DIAGNOSIS — Z8673 Personal history of transient ischemic attack (TIA), and cerebral infarction without residual deficits: Secondary | ICD-10-CM | POA: Insufficient documentation

## 2013-03-17 HISTORY — DX: Anxiety disorder, unspecified: F41.9

## 2013-03-17 HISTORY — DX: Fibromyalgia: M79.7

## 2013-03-17 LAB — CBC
HCT: 38.7 % (ref 36.0–46.0)
Hemoglobin: 14.1 g/dL (ref 12.0–15.0)
MCH: 31.9 pg (ref 26.0–34.0)
MCHC: 36.4 g/dL — ABNORMAL HIGH (ref 30.0–36.0)
MCV: 87.6 fL (ref 78.0–100.0)
Platelets: 358 10*3/uL (ref 150–400)
RBC: 4.42 MIL/uL (ref 3.87–5.11)
RDW: 12.4 % (ref 11.5–15.5)
WBC: 9.3 10*3/uL (ref 4.0–10.5)

## 2013-03-17 LAB — COMPREHENSIVE METABOLIC PANEL
BUN: 9 mg/dL (ref 6–23)
CO2: 24 mEq/L (ref 19–32)
Chloride: 102 mEq/L (ref 96–112)
Creatinine, Ser: 0.61 mg/dL (ref 0.50–1.10)
GFR calc non Af Amer: >90 mL/min (ref >90)
Glucose, Bld: 94 mg/dL (ref 70–99)
Total Bilirubin: 0.3 mg/dL (ref 0.3–1.2)

## 2013-03-17 LAB — URINALYSIS, ROUTINE W REFLEX MICROSCOPIC
Glucose, UA: NEGATIVE mg/dL
Ketones, ur: NEGATIVE mg/dL
Leukocytes, UA: NEGATIVE
Nitrite: NEGATIVE
Specific Gravity, Urine: <1.005 — ABNORMAL LOW (ref 1.005–1.030)
pH: 7.5 (ref 5.0–8.0)

## 2013-03-17 LAB — POCT I-STAT TROPONIN I
Troponin i, poc: 0 ng/mL (ref 0.00–0.08)
Troponin i, poc: 0 ng/mL (ref 0.00–0.08)

## 2013-03-17 LAB — URINE MICROSCOPIC-ADD ON

## 2013-03-17 MED ORDER — MORPHINE SULFATE 4 MG/ML IJ SOLN
4.0000 mg | Freq: Once | INTRAMUSCULAR | Status: AC
  Administered 2013-03-17: 4 mg via INTRAVENOUS
  Filled 2013-03-17: qty 1

## 2013-03-17 MED ORDER — IOHEXOL 350 MG/ML SOLN
100.0000 mL | Freq: Once | INTRAVENOUS | Status: AC | PRN
  Administered 2013-03-17: 100 mL via INTRAVENOUS

## 2013-03-17 MED ORDER — HYDROMORPHONE HCL PF 1 MG/ML IJ SOLN
1.0000 mg | Freq: Once | INTRAMUSCULAR | Status: AC
  Administered 2013-03-17: 1 mg via INTRAVENOUS
  Filled 2013-03-17: qty 1

## 2013-03-17 MED ORDER — PREDNISONE 20 MG PO TABS: 40.0000 mg | ORAL_TABLET | Freq: Every day | ORAL | Status: DC

## 2013-03-17 MED ORDER — KETOROLAC TROMETHAMINE 30 MG/ML IJ SOLN
30.0000 mg | Freq: Once | INTRAMUSCULAR | Status: AC
  Administered 2013-03-17: 30 mg via INTRAVENOUS
  Filled 2013-03-17: qty 1

## 2013-03-17 MED ORDER — ALBUTEROL SULFATE HFA 108 (90 BASE) MCG/ACT IN AERS: 2.0000 | INHALATION_SPRAY | RESPIRATORY_TRACT | Status: DC | PRN

## 2013-03-17 NOTE — ED Notes (Signed)
Pt states the pain medication did work; pt states pain in chest still there but better than it was

## 2013-03-17 NOTE — ED Notes (Signed)
Pt states that she is sore all over and feels like "been hit with a ball bat." Pt states this pain has been going on for 2-3 days; pt states she wants to know why she feels like her skin is tight; pt c/o numbness/tingling in arms, hands and legs; normal, color, pulses and capillary refill noted in arms bilaterally

## 2013-03-17 NOTE — ED Notes (Signed)
Pt states chest pain feels like there is a brick on chest; pt states headache still there and pain has now moved to neck; no stiffness in neck upon movement; pt alert and menating appropriately; pt states her skin feels tight

## 2013-03-17 NOTE — ED Notes (Signed)
Pt states she has a hx of anxiety.  Pt states she has been experiencing sob x 3 days and has been taking ativan with no relief.  Last night she began experiencing sternal chest pain and noticed that her fingers were swelling and were tingling (she takes lasix for swelling in hands r/t side effect of fibromyalgia med).  Pt also states hx of 3 tia's in 2012 and hx of PE's in family.  Denies any recent surgeries or travel.

## 2013-03-17 NOTE — ED Provider Notes (Addendum)
History     CSN: [id number]  Arrival date & time 03/17/13  1222   First MD Initiated Contact with Patient 03/17/13 1559      Chief Complaint  Patient presents with  . Chest Pain  . Shortness of Breath    (Consider location/radiation/quality/duration/timing/severity/associated sxs/prior treatment) HPI Comments: Comes to the ER for evaluation of chest pain. Patient reports that she started having difficulty breathing 3 days ago. Symptoms have progressively worsened. Today she has been experiencing pain in the Center of her chest it hurts when she moves or takes a deep breath. She does report that she has had increased swelling of her hands and feet over the last couple of days. Patient says she has a history of anxiety and has taken Ativan but has not helped.  Patient is a 42 y.o. female presenting with chest pain and shortness of breath.  Chest Pain Associated symptoms: shortness of breath   Associated symptoms: no fever   Shortness of Breath Associated symptoms: chest pain   Associated symptoms: no fever     Past Medical History  Diagnosis Date  . TIA (transient ischemic attack)     pt states 2 since 4/12  . Hypertension   . Anxiety   . Fibromyalgia     Past Surgical History  Procedure Laterality Date  . Abdominal hysterectomy    . Tonsillectomy      No family history on file.  History  Substance Use Topics  . Smoking status: Never Smoker   . Smokeless tobacco: Not on file  . Alcohol Use: No    OB History   Grav Para Term Preterm Abortions TAB SAB Ect Mult Living                  Review of Systems  Constitutional: Negative for fever.  Respiratory: Positive for shortness of breath.   Cardiovascular: Positive for chest pain and leg swelling.  Psychiatric/Behavioral: The patient is nervous/anxious.   All other systems reviewed and are negative.    Allergies  Methadone and Other  Home Medications   Current Outpatient Rx  Name  Route  Sig  Dispense   Refill  . ALPRAZolam (XANAX) 1 MG tablet   Oral   Take 1 mg by mouth at bedtime as needed.         Marland Kitchen buPROPion (WELLBUTRIN) 100 MG tablet   Oral   Take 100 mg by mouth 2 (two) times daily.         Marland Kitchen desvenlafaxine (PRISTIQ) 100 MG 24 hr tablet   Oral   Take 100 mg by mouth daily.         Marland Kitchen lisdexamfetamine (VYVANSE) 20 MG capsule   Oral   Take 20 mg by mouth every morning.         . metoprolol (LOPRESSOR) 100 MG tablet   Oral   Take 100 mg by mouth 2 (two) times daily.         Marland Kitchen oxymorphone (OPANA) 10 MG tablet   Oral   Take 10 mg by mouth every 4 (four) hours as needed.           BP 127/98  Pulse 90  Temp(Src) 98.3 F (36.8 C)  Resp 15  Ht 5\' 6"  (1.676 m)  Wt 152 lb (68.947 kg)  BMI 24.55 kg/m2  SpO2 100%  Physical Exam  Constitutional: She is oriented to person, place, and time. She appears well-developed and well-nourished. No distress.  HENT:  Head: Normocephalic  and atraumatic.  Right Ear: Hearing normal.  Nose: Nose normal.  Mouth/Throat: Oropharynx is clear and moist and mucous membranes are normal.  Eyes: Conjunctivae and EOM are normal. Pupils are equal, round, and reactive to light.  Neck: Normal range of motion. Neck supple.  Cardiovascular: Normal rate, regular rhythm, S1 normal and S2 normal.  Exam reveals no gallop and no friction rub.   No murmur heard. Pulmonary/Chest: Effort normal and breath sounds normal. No respiratory distress. She exhibits no tenderness.  Abdominal: Soft. Normal appearance and bowel sounds are normal. There is no hepatosplenomegaly. There is no tenderness. There is no rebound, no guarding, no tenderness at McBurney's point and negative Murphy's sign. No hernia.  Musculoskeletal: Normal range of motion.  Neurological: She is alert and oriented to person, place, and time. She has normal strength. No cranial nerve deficit or sensory deficit. Coordination normal. GCS eye subscore is 4. GCS verbal subscore is 5. GCS  motor subscore is 6.  Skin: Skin is warm, dry and intact. No rash noted. No cyanosis.  Psychiatric: Her speech is normal and behavior is normal. Thought content normal. Her mood appears anxious.    ED Course  Procedures (including critical care time)  EKG:  Date: 03/17/2013  Rate: 105  Rhythm: sinus tachycardia  QRS Axis: normal  Intervals: normal  ST/T Wave abnormalities: normal  Conduction Disutrbances:none  Narrative Interpretation:   Old EKG Reviewed: unchanged    Labs Reviewed  CBC - Abnormal; Notable for the following:    MCHC 36.4 (*)    All other components within normal limits  COMPREHENSIVE METABOLIC PANEL  POCT I-STAT TROPONIN I   Dg Chest 2 View  03/17/2013  *RADIOLOGY REPORT*  Clinical Data: Chest pain  CHEST - 2 VIEW  Comparison: September 17, 2011  Findings: Lungs clear.  Heart size and pulmonary vascularity are normal.  No adenopathy.  No bone lesions.  No pneumothorax.  IMPRESSION: No abnormality noted.   Original Report Authenticated By: Bretta Bang, M.D.    Ct Angio Chest Pe W/cm &/or Wo Cm  03/17/2013  *RADIOLOGY REPORT*  Clinical Data: Pleuritic chest pain, shortness of breath, tachycardia  CT ANGIOGRAPHY CHEST  Technique:  Multidetector CT imaging of the chest using the standard protocol during bolus administration of intravenous contrast. Multiplanar reconstructed images including MIPs were obtained and reviewed to evaluate the vascular anatomy.  Contrast: OMNIPAQUE IOHEXOL 350 MG/ML SOLN  Comparison: 05/23/2011, 03/17/2013  Findings: Pulmonary arteries are well visualized.  No significant filling defect or pulmonary embolus demonstrated by CTA.  Intact thoracic aorta.  Negative for aneurysm or dissection.  Normal heart size.  No pericardial or pleural effusion.  No hiatal hernia.  Included upper abdomen demonstrates no acute process  Lung windows demonstrate no focal airspace process, collapse, consolidation, edema, or interstitial change.  Trachea  and airways are patent.  No suspicious pulmonary nodule or mass.  No chest wall asymmetry or osseous abnormality.  IMPRESSION: Negative for significant acute pulmonary embolus by CTA.  No acute intrathoracic process.   Original Report Authenticated By: Judie Petit. Miles Costain, M.D.      Diagnosis: Atypical chest pain    MDM  Patient comes to the ER for evaluation of shortness of breath and chest pain. Patient does have a history of anxiety as well as chronic pain syndrome and fibromyalgia. Pain was somewhat atypical. Her only cardiac risk factor is hypertension. EKG arrival did not show any acute changes other than tachycardia. Troponin was negative. Because of the patient's  tachycardia and pleuritic chest pain with a family history of clotting disorder, CT scan was performed to further evaluate for possibility of PE. CT scan was unremarkable, no evidence of PE.  Person the patient's current presentation and minimal cardiac risk factors I do not suspect acute coronary syndrome as a cause of her symptoms. Patient treated with analgesia and will be discharged.  Addendum: After discussing possible discharge with the patient, she began to experience increased shortness of breath and chest discomfort. She also began to complain of headache. She intermittently complained of pain all over. Patient does have a chronic pain syndrome with fibromyalgia. PE has been ruled out as has other pulmonary etiology. Patient therefore had repeat troponin to assess rule out cardiac etiology. Patient has been here for 7 hours with chest discomfort and had discomfort for 3 solid days prior to arrival. 2 sets of troponins have been negative. Her EKG didn't show any abnormalities either. I believe this indicates the patient can be worked up as an outpatient.      Gilda Crease, MD 03/17/13 1732  Gilda Crease, MD 03/17/13 2010

## 2013-03-17 NOTE — ED Notes (Signed)
Pt requested wheelchair to get out; pt instructed not to drive and states husband is picking her up and driving home; pts husband endorses she will not be driving home; Pt alert and mentating appropriately upon d/c teaching; pt given d/c teaching and prescriptions; pt verbalizes understanding and need for follow up care with PCP; NAD noted upon d/c; pt denies need for further questions upon d/c.

## 2013-03-17 NOTE — ED Notes (Signed)
Pt ambulated to restroom; NAD noted upon ambulated; pt alert and mentating appropriately

## 2013-04-10 ENCOUNTER — Encounter (HOSPITAL_BASED_OUTPATIENT_CLINIC_OR_DEPARTMENT_OTHER): Payer: Self-pay | Admitting: *Deleted

## 2013-04-10 ENCOUNTER — Emergency Department (HOSPITAL_BASED_OUTPATIENT_CLINIC_OR_DEPARTMENT_OTHER)
Admission: EM | Admit: 2013-04-10 | Discharge: 2013-04-10 | Discharge status: Against Medical Advice | Payer: Managed Care, Other (non HMO) | Attending: Emergency Medicine | Admitting: Emergency Medicine

## 2013-04-10 DIAGNOSIS — M542 Cervicalgia: Secondary | ICD-10-CM | POA: Insufficient documentation

## 2013-04-10 DIAGNOSIS — R197 Diarrhea, unspecified: Secondary | ICD-10-CM | POA: Insufficient documentation

## 2013-04-10 HISTORY — DX: Depression, unspecified: F32.A

## 2013-04-10 HISTORY — DX: Major depressive disorder, single episode, unspecified: F32.9

## 2013-04-10 HISTORY — DX: Restless legs syndrome: G25.81

## 2013-04-10 NOTE — ED Notes (Addendum)
Reports diarrhea x 8 days- 5-6 episodes daily- states "i feel full", denies vomiting- c/o neck pain also x 3-4 days- seen by PCP yesterday

## 2013-06-15 DIAGNOSIS — G2581 Restless legs syndrome: Secondary | ICD-10-CM | POA: Insufficient documentation

## 2013-06-15 DIAGNOSIS — F3289 Other specified depressive episodes: Secondary | ICD-10-CM | POA: Insufficient documentation

## 2013-06-15 DIAGNOSIS — Z8673 Personal history of transient ischemic attack (TIA), and cerebral infarction without residual deficits: Secondary | ICD-10-CM | POA: Insufficient documentation

## 2013-06-15 DIAGNOSIS — I10 Essential (primary) hypertension: Secondary | ICD-10-CM | POA: Insufficient documentation

## 2013-06-15 DIAGNOSIS — F329 Major depressive disorder, single episode, unspecified: Secondary | ICD-10-CM | POA: Insufficient documentation

## 2013-06-15 DIAGNOSIS — Z79899 Other long term (current) drug therapy: Secondary | ICD-10-CM | POA: Insufficient documentation

## 2013-06-15 DIAGNOSIS — IMO0001 Reserved for inherently not codable concepts without codable children: Secondary | ICD-10-CM | POA: Insufficient documentation

## 2013-06-15 DIAGNOSIS — R209 Unspecified disturbances of skin sensation: Secondary | ICD-10-CM | POA: Insufficient documentation

## 2013-06-15 DIAGNOSIS — F411 Generalized anxiety disorder: Secondary | ICD-10-CM | POA: Insufficient documentation

## 2013-06-16 ENCOUNTER — Emergency Department (HOSPITAL_BASED_OUTPATIENT_CLINIC_OR_DEPARTMENT_OTHER)
Admission: EM | Admit: 2013-06-16 | Discharge: 2013-06-16 | Disposition: A | Discharge status: Home / Self Care | Payer: Managed Care, Other (non HMO) | Attending: Emergency Medicine | Admitting: Emergency Medicine

## 2013-06-16 ENCOUNTER — Emergency Department (HOSPITAL_COMMUNITY)
Admission: EM | Admit: 2013-06-16 | Discharge: 2013-06-16 | Discharge status: Against Medical Advice | Payer: Managed Care, Other (non HMO) | Attending: Emergency Medicine | Admitting: Emergency Medicine

## 2013-06-16 ENCOUNTER — Emergency Department (HOSPITAL_BASED_OUTPATIENT_CLINIC_OR_DEPARTMENT_OTHER): Payer: Managed Care, Other (non HMO)

## 2013-06-16 ENCOUNTER — Encounter (HOSPITAL_COMMUNITY): Payer: Self-pay

## 2013-06-16 ENCOUNTER — Encounter (HOSPITAL_BASED_OUTPATIENT_CLINIC_OR_DEPARTMENT_OTHER): Payer: Self-pay

## 2013-06-16 DIAGNOSIS — R51 Headache: Secondary | ICD-10-CM | POA: Insufficient documentation

## 2013-06-16 DIAGNOSIS — F341 Dysthymic disorder: Secondary | ICD-10-CM | POA: Insufficient documentation

## 2013-06-16 DIAGNOSIS — Z79899 Other long term (current) drug therapy: Secondary | ICD-10-CM | POA: Insufficient documentation

## 2013-06-16 DIAGNOSIS — G2581 Restless legs syndrome: Secondary | ICD-10-CM | POA: Insufficient documentation

## 2013-06-16 DIAGNOSIS — IMO0001 Reserved for inherently not codable concepts without codable children: Secondary | ICD-10-CM | POA: Insufficient documentation

## 2013-06-16 DIAGNOSIS — I10 Essential (primary) hypertension: Secondary | ICD-10-CM | POA: Insufficient documentation

## 2013-06-16 DIAGNOSIS — F419 Anxiety disorder, unspecified: Secondary | ICD-10-CM

## 2013-06-16 DIAGNOSIS — F32A Depression, unspecified: Secondary | ICD-10-CM

## 2013-06-16 DIAGNOSIS — Z8673 Personal history of transient ischemic attack (TIA), and cerebral infarction without residual deficits: Secondary | ICD-10-CM | POA: Insufficient documentation

## 2013-06-16 LAB — CBC WITH DIFFERENTIAL/PLATELET
Basophils Absolute: 0 10*3/uL (ref 0.0–0.1)
Basophils Relative: 0 % (ref 0–1)
Basophils Relative: 0 % (ref 0–1)
Eosinophils Absolute: 0.1 10*3/uL (ref 0.0–0.7)
Eosinophils Absolute: 0.1 10*3/uL (ref 0.0–0.7)
Eosinophils Relative: 1 % (ref 0–5)
Lymphs Abs: 2.7 10*3/uL (ref 0.7–4.0)
MCH: 31.3 pg (ref 26.0–34.0)
MCH: 32.6 pg (ref 26.0–34.0)
MCHC: 35.3 g/dL (ref 30.0–36.0)
MCHC: 36.1 g/dL — ABNORMAL HIGH (ref 30.0–36.0)
MCV: 88.8 fL (ref 78.0–100.0)
Neutro Abs: 7.5 10*3/uL (ref 1.7–7.7)
Neutrophils Relative %: 67 % (ref 43–77)
Neutrophils Relative %: 70 % (ref 43–77)
Platelets: 374 10*3/uL (ref 150–400)
Platelets: 378 10*3/uL (ref 150–400)
RBC: 4.28 MIL/uL (ref 3.87–5.11)
RBC: 4.39 MIL/uL (ref 3.87–5.11)

## 2013-06-16 LAB — BASIC METABOLIC PANEL
BUN: 8 mg/dL (ref 6–23)
Calcium: 9.2 mg/dL (ref 8.4–10.5)
Chloride: 103 mEq/L (ref 96–112)
GFR calc Af Amer: >90 mL/min (ref >90)
GFR calc Af Amer: >90 mL/min (ref >90)
GFR calc non Af Amer: >90 mL/min (ref >90)
GFR calc non Af Amer: >90 mL/min (ref >90)
Glucose, Bld: 104 mg/dL — ABNORMAL HIGH (ref 70–99)
Potassium: 4.4 mEq/L (ref 3.5–5.1)
Potassium: 4.4 mEq/L (ref 3.5–5.1)
Sodium: 138 mEq/L (ref 135–145)
Sodium: 136 mEq/L (ref 135–145)

## 2013-06-16 LAB — ETHANOL: Alcohol, Ethyl (B): <11 mg/dL (ref 0–11)

## 2013-06-16 LAB — RAPID URINE DRUG SCREEN, HOSP PERFORMED
Barbiturates: NOT DETECTED
Cocaine: NOT DETECTED
Opiates: NOT DETECTED

## 2013-06-16 NOTE — ED Notes (Signed)
Family at bedside. 

## 2013-06-16 NOTE — ED Notes (Signed)
Pt's name called to go to a room @ 4:41am no answer

## 2013-06-16 NOTE — ED Notes (Signed)
Pt reports a 4-5 month history of anxiety and mood swings,  She reports increased family and work stressors.  She was see by PMD last week and medications adjusted.  Pt is tearful and states she feels like she is having a "nervous breakdown".

## 2013-06-16 NOTE — ED Provider Notes (Addendum)
History    CSN: [id number] Arrival date & time 06/16/13  1114  First MD Initiated Contact with Patient 06/16/13 1205     Chief Complaint  Patient presents with  . Anxiety  . Depression   (Consider location/radiation/quality/duration/timing/severity/associated sxs/prior Treatment) HPI Comments: Patient states she has a history of anxiety and depression has been on medication for years however the last 6 months things are worsening. She feels that she is losing control. She states her daughter's are going to college and when they got to live with her father which has really affected her mentally. She has been seeing her primary physician his attempted to change her psychiatric medications but is not working. She denies suicidal or homicidal ideation at this time.  Also patient states she has intermittent episodes of headache with facial droop. The last episode she had was last night but is now resolved except for headache. She states her blood pressure will be high however here the highest blood pressure she's had is 155/111. She has taken her blood pressure medication today.  Patient is a 42 y.o. female presenting with anxiety. The history is provided by the patient.  Anxiety This is a chronic problem. Episode onset: worse in the last 6 months. The problem occurs constantly. The problem has been gradually worsening. Associated symptoms include headaches. The symptoms are aggravated by stress. Nothing relieves the symptoms. Treatments tried: medication.   Past Medical History  Diagnosis Date  . TIA (transient ischemic attack)     pt states 2 since 4/12  . Hypertension   . Anxiety   . Fibromyalgia   . Depression   . Restless leg syndrome   . Depression    Past Surgical History  Procedure Laterality Date  . Abdominal hysterectomy    . Tonsillectomy     No family history on file. History  Substance Use Topics  . Smoking status: Never Smoker   . Smokeless tobacco: Never Used  .  Alcohol Use: Yes     Comment: occasional   OB History   Grav Para Term Preterm Abortions TAB SAB Ect Mult Living                 Review of Systems  Neurological: Positive for headaches.  All other systems reviewed and are negative.    Allergies  Methadone and Other  Home Medications   Current Outpatient Rx  Name  Route  Sig  Dispense  Refill  . desvenlafaxine (PRISTIQ) 100 MG 24 hr tablet   Oral   Take 100 mg by mouth daily.         Marland Kitchen lamoTRIgine (LAMICTAL) 150 MG tablet   Oral   Take 300 mg by mouth 2 (two) times daily.          Marland Kitchen lisdexamfetamine (VYVANSE) 70 MG capsule   Oral   Take 70 mg by mouth every morning.         Marland Kitchen LORazepam (ATIVAN) 1 MG tablet   Oral   Take 1 mg by mouth every 8 (eight) hours.         . metoprolol (LOPRESSOR) 100 MG tablet   Oral   Take 100 mg by mouth 2 (two) times daily.         Marland Kitchen morphine (MSIR) 30 MG tablet   Oral   Take 30 mg by mouth every 4 (four) hours as needed for pain.          BP 144/105  Pulse 106  Temp(Src)  98.4 F (36.9 C) (Oral)  Resp 16  Ht 5\' 6"  (1.676 m)  Wt 154 lb (69.854 kg)  BMI 24.87 kg/m2  SpO2 100% Physical Exam  Nursing note and vitals reviewed. Constitutional: She is oriented to person, place, and time. She appears well-developed and well-nourished. No distress.  HENT:  Head: Normocephalic and atraumatic.  Mouth/Throat: Oropharynx is clear and moist.  Eyes: Conjunctivae and EOM are normal. Pupils are equal, round, and reactive to light.  Neck: Normal range of motion. Neck supple.  Cardiovascular: Normal rate, regular rhythm and intact distal pulses.   No murmur heard. Pulmonary/Chest: Effort normal and breath sounds normal. No respiratory distress. She has no wheezes. She has no rales.  Abdominal: Soft. She exhibits no distension. There is no tenderness. There is no rebound and no guarding.  Musculoskeletal: Normal range of motion. She exhibits no edema and no tenderness.   Neurological: She is alert and oriented to person, place, and time.  Skin: Skin is warm and dry. No rash noted. No erythema.  Psychiatric: Her behavior is normal. Her affect is blunt. Her speech is not tangential. She expresses impulsivity. She exhibits a depressed mood. She expresses no homicidal and no suicidal ideation.  insomnia    ED Course  Procedures (including critical care time) Labs Reviewed  CBC WITH DIFFERENTIAL - Abnormal; Notable for the following:    WBC 10.6 (*)    MCHC 36.1 (*)    All other components within normal limits  BASIC METABOLIC PANEL - Abnormal; Notable for the following:    Glucose, Bld 107 (*)    All other components within normal limits  URINE RAPID DRUG SCREEN (HOSP PERFORMED) - Abnormal; Notable for the following:    Tetrahydrocannabinol POSITIVE (*)    All other components within normal limits  ETHANOL   Ct Head Wo Contrast  06/16/2013   *RADIOLOGY REPORT*  Clinical Data:  Headaches  CT HEAD WITHOUT CONTRAST  Technique:  Contiguous axial images were obtained from the base of the skull through the vertex without contrast  Comparison:  09/18/2011  Findings:  The brain has a normal appearance without evidence for hemorrhage, acute infarction, hydrocephalus, or mass lesion.  There is no extra axial fluid collection.  The skull and paranasal sinuses are normal.  IMPRESSION: Normal CT of the head without contrast.   Original Report Authenticated By: Judie Petit. Shick, M.D.   1. Anxiety and depression     MDM    Patient here complaining of worsening anxiety, mood swings, depression and episodes of rage. She states years ago she had a nervous breakdown and feels it coming on again. She's been taking pristiq, Abilify for years and recently started Lamictal 2 months ago.  However she feels like it is making her worse. She denies any suicidal ideation and homicidal ideation. When discussing it she felt that she needed hospitalization she denies running inpatient  treatment but does want to be seen by a psychiatrist.  The patient's only other medical history is significant for hypertension. She states she gets intermittent headaches and occasional  facial droop but denies any slurred speech. She states currently she has a mild headache.  1:42 PM Labs and head CT neg.  Pt wants to try outpt resources at this time and do not feel that she needs inpt.  Gwyneth Sprout, MD 06/16/13 1342  Gwyneth Sprout, MD 06/16/13 1343

## 2013-06-16 NOTE — ED Notes (Signed)
Patient presents reporting generalized facial numbness starting at 2230 tonight.  Patient has hx of TIA. No facial drooping, arm drift or slurred speech noted. Patient also reporting headache.

## 2013-09-20 ENCOUNTER — Encounter (HOSPITAL_COMMUNITY): Payer: Self-pay | Admitting: *Deleted

## 2013-09-20 ENCOUNTER — Emergency Department (HOSPITAL_COMMUNITY): Payer: Managed Care, Other (non HMO)

## 2013-09-20 ENCOUNTER — Emergency Department (HOSPITAL_COMMUNITY)
Admission: EM | Admit: 2013-09-20 | Discharge: 2013-09-20 | Disposition: A | Discharge status: Home / Self Care | Payer: Managed Care, Other (non HMO) | Attending: Emergency Medicine | Admitting: Emergency Medicine

## 2013-09-20 DIAGNOSIS — F329 Major depressive disorder, single episode, unspecified: Secondary | ICD-10-CM | POA: Insufficient documentation

## 2013-09-20 DIAGNOSIS — F3289 Other specified depressive episodes: Secondary | ICD-10-CM | POA: Insufficient documentation

## 2013-09-20 DIAGNOSIS — F411 Generalized anxiety disorder: Secondary | ICD-10-CM | POA: Insufficient documentation

## 2013-09-20 DIAGNOSIS — Z79899 Other long term (current) drug therapy: Secondary | ICD-10-CM | POA: Insufficient documentation

## 2013-09-20 DIAGNOSIS — Z8739 Personal history of other diseases of the musculoskeletal system and connective tissue: Secondary | ICD-10-CM | POA: Insufficient documentation

## 2013-09-20 DIAGNOSIS — R51 Headache: Secondary | ICD-10-CM | POA: Insufficient documentation

## 2013-09-20 DIAGNOSIS — R0602 Shortness of breath: Secondary | ICD-10-CM | POA: Insufficient documentation

## 2013-09-20 DIAGNOSIS — Z8673 Personal history of transient ischemic attack (TIA), and cerebral infarction without residual deficits: Secondary | ICD-10-CM | POA: Insufficient documentation

## 2013-09-20 DIAGNOSIS — R202 Paresthesia of skin: Secondary | ICD-10-CM

## 2013-09-20 DIAGNOSIS — R0789 Other chest pain: Secondary | ICD-10-CM

## 2013-09-20 DIAGNOSIS — R209 Unspecified disturbances of skin sensation: Secondary | ICD-10-CM | POA: Insufficient documentation

## 2013-09-20 DIAGNOSIS — I10 Essential (primary) hypertension: Secondary | ICD-10-CM | POA: Insufficient documentation

## 2013-09-20 DIAGNOSIS — R519 Headache, unspecified: Secondary | ICD-10-CM

## 2013-09-20 DIAGNOSIS — Z8659 Personal history of other mental and behavioral disorders: Secondary | ICD-10-CM

## 2013-09-20 DIAGNOSIS — Z8669 Personal history of other diseases of the nervous system and sense organs: Secondary | ICD-10-CM | POA: Insufficient documentation

## 2013-09-20 LAB — CBC
HCT: 36.5 % (ref 36.0–46.0)
Hemoglobin: 13.4 g/dL (ref 12.0–15.0)
RBC: 4.18 MIL/uL (ref 3.87–5.11)
RDW: 12.2 % (ref 11.5–15.5)
WBC: 9.6 10*3/uL (ref 4.0–10.5)

## 2013-09-20 LAB — DIFFERENTIAL
Lymphocytes Relative: 36 % (ref 12–46)
Monocytes Absolute: 0.5 10*3/uL (ref 0.1–1.0)
Monocytes Relative: 6 % (ref 3–12)
Neutro Abs: 5.5 10*3/uL (ref 1.7–7.7)

## 2013-09-20 LAB — APTT: aPTT: 29 seconds (ref 24–37)

## 2013-09-20 LAB — COMPREHENSIVE METABOLIC PANEL
ALT: 9 U/L (ref 0–35)
AST: 12 U/L (ref 0–37)
Albumin: 4.2 g/dL (ref 3.5–5.2)
Alkaline Phosphatase: 88 U/L (ref 39–117)
BUN: 5 mg/dL — ABNORMAL LOW (ref 6–23)
CO2: 24 mEq/L (ref 19–32)
Chloride: 104 mEq/L (ref 96–112)
Creatinine, Ser: 0.55 mg/dL (ref 0.50–1.10)
GFR calc Af Amer: >90 mL/min (ref >90)
GFR calc non Af Amer: >90 mL/min (ref >90)
Sodium: 139 mEq/L (ref 135–145)
Total Bilirubin: 0.5 mg/dL (ref 0.3–1.2)

## 2013-09-20 LAB — TROPONIN I: Troponin I: <0.3 ng/mL (ref <0.30)

## 2013-09-20 LAB — POCT I-STAT TROPONIN I: Troponin i, poc: 0 ng/mL (ref 0.00–0.08)

## 2013-09-20 MED ORDER — KETOROLAC TROMETHAMINE 60 MG/2ML IM SOLN
60.0000 mg | Freq: Once | INTRAMUSCULAR | Status: AC
  Administered 2013-09-20: 60 mg via INTRAMUSCULAR
  Filled 2013-09-20: qty 2

## 2013-09-20 MED ORDER — BUTALBITAL-APAP-CAFFEINE 50-325-40 MG PO TABS: 1.0000 | ORAL_TABLET | Freq: Four times a day (QID) | ORAL | Status: DC | PRN

## 2013-09-20 MED ORDER — LORAZEPAM 2 MG/ML IJ SOLN
2.0000 mg | Freq: Once | INTRAMUSCULAR | Status: AC
  Administered 2013-09-20: 2 mg via INTRAVENOUS
  Filled 2013-09-20: qty 1

## 2013-09-20 NOTE — ED Notes (Signed)
Pt transported to MRI via stretcher.  

## 2013-09-20 NOTE — ED Notes (Signed)
Multiple complaints. Reports hx of tia in 2012. Having headache and numbness sensation to entire face and left hand. Having pain to right side of chest and into left arm and also having sob. ekg being done at triage, no acute distress noted.

## 2013-09-20 NOTE — ED Provider Notes (Signed)
CSN: [id number]     Arrival date & time 09/20/13  1656 History   First MD Initiated Contact with Patient 09/20/13 1727     Chief Complaint  Patient presents with  . Numbness  . Hypertension  . Chest Pain   (Consider location/radiation/quality/duration/timing/severity/associated sxs/prior Treatment) HPI  Rebecca Mccullough is a 41 y.o. female  past medical history significant for TIA, anxiety and fibromyalgia complaining of headache, paresthesia to face and left arm, left arm weakness, chest pain over the course of the last 7 days worsening significantly over the last 24 hours. Headache is typical for her headache exacerbation is when her blood pressure is elevated. Headache is diffuse, throbbing, no exacerbating or alleviating factors identity Patient had taken her blood pressure at home and it was 160/110. She states that she has a numbness to her entire face and left upper extremity, she also states that the left hand is weak. She also endorses a right-sided chest pain associated with shortness of breath, no nausea vomiting or diaphoresis. She denies he denies dysarthria, ataxia, nausea vomiting, change in bowel or bladder habits.  Past Medical History  Diagnosis Date  . TIA (transient ischemic attack)     pt states 2 since 4/12  . Hypertension   . Anxiety   . Fibromyalgia   . Depression   . Restless leg syndrome   . Depression    Past Surgical History  Procedure Laterality Date  . Abdominal hysterectomy    . Tonsillectomy     History reviewed. No pertinent family history. History  Substance Use Topics  . Smoking status: Never Smoker   . Smokeless tobacco: Never Used  . Alcohol Use: Yes     Comment: occasional   OB History   Grav Para Term Preterm Abortions TAB SAB Ect Mult Living                 Review of Systems 10 systems reviewed and found to be negative, except as noted in the HPI  Allergies  Methadone and Other  Home Medications   Current Outpatient Rx  Name   Route  Sig  Dispense  Refill  . desvenlafaxine (PRISTIQ) 100 MG 24 hr tablet   Oral   Take 100 mg by mouth daily.         Marland Kitchen LORazepam (ATIVAN) 1 MG tablet   Oral   Take 1 mg by mouth every 8 (eight) hours.         . metoprolol (LOPRESSOR) 100 MG tablet   Oral   Take 100 mg by mouth 2 (two) times daily.         Marland Kitchen morphine (MSIR) 30 MG tablet   Oral   Take 30 mg by mouth every 4 (four) hours as needed for pain.          BP 149/94  Pulse 108  Temp(Src) 98.3 F (36.8 C) (Oral)  Resp 16  Ht 5\' 6"  (1.676 m)  Wt 149 lb (67.586 kg)  BMI 24.06 kg/m2  SpO2 100% Physical Exam  Nursing note and vitals reviewed. Constitutional: She is oriented to person, place, and time. She appears well-developed and well-nourished. No distress.  HENT:  Head: Normocephalic and atraumatic.  Mouth/Throat: Oropharynx is clear and moist.  Eyes: Conjunctivae and EOM are normal. Pupils are equal, round, and reactive to light.  Neck: Normal range of motion.  No midline tenderness to palpation or step-offs appreciated. Patient has full range of motion without pain.   Cardiovascular: Normal  rate, regular rhythm and intact distal pulses.   Pulmonary/Chest: Effort normal and breath sounds normal. No stridor. No respiratory distress. She has no wheezes. She has no rales. She exhibits no tenderness.  Abdominal: Soft. Bowel sounds are normal. She exhibits no distension and no mass. There is no tenderness. There is no rebound and no guarding.  Musculoskeletal: Normal range of motion. She exhibits no edema.  Neurological: She is alert and oriented to person, place, and time.  Strength is 4/5 to the right upper extremity, 5 out of 5 to all other extremities. Sensation is intact to pinprick and light touch. Cranial nerves are otherwise unremarkable.  Skin: Skin is warm.  Psychiatric: She has a normal mood and affect.    ED Course  Procedures (including critical care time) Labs Review Labs Reviewed   PROTIME-INR  APTT  CBC  DIFFERENTIAL  COMPREHENSIVE METABOLIC PANEL  TROPONIN I   Imaging Review Dg Chest 2 View  09/20/2013   CLINICAL DATA:  Short of breath. Chest pain. Cough and congestion.  EXAM: CHEST  2 VIEW  COMPARISON:  03/17/2013  FINDINGS: The heart size and mediastinal contours are within normal limits. Both lungs are clear. The visualized skeletal structures are unremarkable.  IMPRESSION: No active cardiopulmonary disease.   Electronically Signed   By: Amie Portland   On: 09/20/2013 18:06   Mr Brain Wo Contrast  09/20/2013   CLINICAL DATA:  42 year old female with headache, numbness, visual changes. History of TIA. Initial encounter.  EXAM: MRI HEAD WITHOUT CONTRAST  TECHNIQUE: Multiplanar, multisequence MR imaging was performed. No intravenous contrast was administered.  COMPARISON:  Head CT 06/16/2013. Brain MRI 09/18/2011.  FINDINGS: Stable and normal cerebral volume. No restricted diffusion to suggest acute infarction. No midline shift, mass effect, evidence of mass lesion, ventriculomegaly, extra-axial collection or acute intracranial hemorrhage. Cervicomedullary junction and pituitary are within normal limits. Negative visualized cervical spine. Major intracranial vascular flow voids are preserved. Stable and normal for age gray and white matter signal throughout the brain.  Visualized orbit soft tissues are within normal limits. Visualized paranasal sinuses and mastoids are clear. Normal bone marrow signal. Normal visualized internal auditory structures. Negative scalp soft tissues.  IMPRESSION: Stable and normal non contrast MRI appearance of the brain.   Electronically Signed   By: Augusto Gamble M.D.   On: 09/20/2013 19:49     Date: 09/20/2013  Rate: 123  Rhythm: Sinus tachycardia  QRS Axis: normal  Intervals: normal  ST/T Wave abnormalities: normal  Conduction Disutrbances:none  Narrative Interpretation:   Old EKG Reviewed: unchanged   MDM   1. Paresthesia   2.  Atypical chest pain   3. HA (headache)   4. H/O anxiety disorder     Filed Vitals:   09/20/13 1708 09/20/13 1712 09/20/13 1741 09/20/13 1947  BP: 133/106  149/94 139/98  Pulse: 112  108 94  Temp: 98.3 F (36.8 C)   98.6 F (37 C)  TempSrc: Oral   Oral  Resp: 18  16 11   Height:  5\' 6"  (1.676 m)    Weight:  149 lb (67.586 kg)    SpO2: 100%  100% 100%     Rebecca Mccullough is a 42 y.o. female with chest pain, shortness of breath, numbness in the face and left arm and left arm weakness. EKG is nonischemic, the pattern of her paresthesia does not fit neuroanatomy, low index of suspicion but considering her left arm weakness it is reasonable to get an MRI at  this time. Blood work is essentially unremarkable.  MRI shows no abnormalities. I have reassured the patient and asked her to follow with her primary care physician. Return precautions were discussed.  Medications  LORazepam (ATIVAN) injection 2 mg (2 mg Intravenous Given 09/20/13 1837)  ketorolac (TORADOL) injection 60 mg (60 mg Intramuscular Given 09/20/13 1847)    Pt is hemodynamically stable, appropriate for, and amenable to discharge at this time. Pt verbalized understanding and agrees with care plan. All questions answered. Outpatient follow-up and specific return precautions discussed.    Discharge Medication List as of 09/20/2013  8:21 PM    START taking these medications   Details  butalbital-acetaminophen-caffeine (FIORICET) 50-325-40 MG per tablet Take 1 tablet by mouth every 6 (six) hours as needed for headache., Starting 09/20/2013, Until Discontinued, Print        Note: Portions of this report may have been transcribed using voice recognition software. Every effort was made to ensure accuracy; however, inadvertent computerized transcription errors may be present      Wynetta Emery, PA-C 09/21/13 0029

## 2013-09-24 NOTE — ED Provider Notes (Signed)
Medical screening examination/treatment/procedure(s) were performed by non-physician practitioner and as supervising physician I was immediately available for consultation/collaboration.   Dagmar Hait, MD 09/24/13 [phone]

## 2013-11-03 ENCOUNTER — Other Ambulatory Visit: Payer: Self-pay

## 2013-11-03 DIAGNOSIS — Z1231 Encounter for screening mammogram for malignant neoplasm of breast: Secondary | ICD-10-CM

## 2013-11-20 ENCOUNTER — Ambulatory Visit: Payer: Managed Care, Other (non HMO)

## 2014-03-15 DIAGNOSIS — R5382 Chronic fatigue, unspecified: Secondary | ICD-10-CM | POA: Insufficient documentation

## 2014-03-15 DIAGNOSIS — N3010 Interstitial cystitis (chronic) without hematuria: Secondary | ICD-10-CM | POA: Insufficient documentation

## 2014-03-15 DIAGNOSIS — K6289 Other specified diseases of anus and rectum: Secondary | ICD-10-CM | POA: Insufficient documentation

## 2014-03-15 DIAGNOSIS — F3289 Other specified depressive episodes: Secondary | ICD-10-CM | POA: Insufficient documentation

## 2014-03-15 DIAGNOSIS — F329 Major depressive disorder, single episode, unspecified: Secondary | ICD-10-CM | POA: Insufficient documentation

## 2014-03-15 DIAGNOSIS — R350 Frequency of micturition: Secondary | ICD-10-CM | POA: Insufficient documentation

## 2014-03-15 DIAGNOSIS — N9489 Other specified conditions associated with female genital organs and menstrual cycle: Secondary | ICD-10-CM | POA: Insufficient documentation

## 2014-03-15 DIAGNOSIS — G9332 Myalgic encephalomyelitis/chronic fatigue syndrome: Secondary | ICD-10-CM | POA: Insufficient documentation

## 2014-03-15 DIAGNOSIS — R109 Unspecified abdominal pain: Secondary | ICD-10-CM | POA: Insufficient documentation

## 2014-03-15 DIAGNOSIS — R3915 Urgency of urination: Secondary | ICD-10-CM | POA: Insufficient documentation

## 2014-03-15 DIAGNOSIS — K5900 Constipation, unspecified: Secondary | ICD-10-CM | POA: Insufficient documentation

## 2014-03-15 DIAGNOSIS — R300 Dysuria: Secondary | ICD-10-CM | POA: Insufficient documentation

## 2014-03-15 DIAGNOSIS — N949 Unspecified condition associated with female genital organs and menstrual cycle: Secondary | ICD-10-CM | POA: Insufficient documentation

## 2014-03-15 DIAGNOSIS — IMO0002 Reserved for concepts with insufficient information to code with codable children: Secondary | ICD-10-CM | POA: Insufficient documentation

## 2014-04-15 DIAGNOSIS — IMO0002 Reserved for concepts with insufficient information to code with codable children: Secondary | ICD-10-CM | POA: Insufficient documentation

## 2014-04-22 ENCOUNTER — Other Ambulatory Visit: Payer: Self-pay | Admitting: Obstetrics & Gynecology

## 2014-04-22 ENCOUNTER — Other Ambulatory Visit (HOSPITAL_COMMUNITY)
Admission: RE | Admit: 2014-04-22 | Discharge: 2014-04-22 | Disposition: A | Discharge status: Home / Self Care | Payer: Managed Care, Other (non HMO) | Source: Ambulatory Visit | Attending: Obstetrics & Gynecology | Admitting: Obstetrics & Gynecology

## 2014-04-22 DIAGNOSIS — Z01419 Encounter for gynecological examination (general) (routine) without abnormal findings: Secondary | ICD-10-CM | POA: Insufficient documentation

## 2014-04-22 DIAGNOSIS — Z1151 Encounter for screening for human papillomavirus (HPV): Secondary | ICD-10-CM | POA: Insufficient documentation

## 2014-10-18 ENCOUNTER — Emergency Department (HOSPITAL_COMMUNITY): Payer: Managed Care, Other (non HMO)

## 2014-10-18 ENCOUNTER — Emergency Department (HOSPITAL_COMMUNITY)
Admission: EM | Admit: 2014-10-18 | Discharge: 2014-10-18 | Discharge status: Against Medical Advice | Payer: Managed Care, Other (non HMO) | Attending: Emergency Medicine | Admitting: Emergency Medicine

## 2014-10-18 ENCOUNTER — Encounter (HOSPITAL_COMMUNITY): Payer: Self-pay | Admitting: Emergency Medicine

## 2014-10-18 DIAGNOSIS — R200 Anesthesia of skin: Secondary | ICD-10-CM | POA: Insufficient documentation

## 2014-10-18 DIAGNOSIS — I10 Essential (primary) hypertension: Secondary | ICD-10-CM | POA: Insufficient documentation

## 2014-10-18 NOTE — ED Notes (Addendum)
Registration reports that has decided not to be seen.  She returned armband and labels.

## 2014-10-18 NOTE — ED Notes (Signed)
Pt states that she began having lt arm numbness and pain.  Also states she is "retaining fluid".  States that she felt numbness to lt side of face and subjective eye droop.  Hx of 2 TIAs.  States that she started noticing these sx at 0800 this morning, 8 hrs ago. Pt is speaking clearly, good facial symmetry.  No lt sided weakness.

## 2015-04-05 ENCOUNTER — Encounter (HOSPITAL_COMMUNITY): Payer: Self-pay | Admitting: Emergency Medicine

## 2015-04-05 ENCOUNTER — Emergency Department (HOSPITAL_COMMUNITY)
Admission: EM | Admit: 2015-04-05 | Discharge: 2015-04-05 | Disposition: A | Discharge status: Home / Self Care | Payer: Managed Care, Other (non HMO) | Attending: Emergency Medicine | Admitting: Emergency Medicine

## 2015-04-05 DIAGNOSIS — R5383 Other fatigue: Secondary | ICD-10-CM | POA: Diagnosis present

## 2015-04-05 DIAGNOSIS — I10 Essential (primary) hypertension: Secondary | ICD-10-CM | POA: Diagnosis not present

## 2015-04-05 DIAGNOSIS — F419 Anxiety disorder, unspecified: Secondary | ICD-10-CM | POA: Insufficient documentation

## 2015-04-05 DIAGNOSIS — Z7982 Long term (current) use of aspirin: Secondary | ICD-10-CM | POA: Diagnosis not present

## 2015-04-05 DIAGNOSIS — Z8673 Personal history of transient ischemic attack (TIA), and cerebral infarction without residual deficits: Secondary | ICD-10-CM | POA: Diagnosis not present

## 2015-04-05 DIAGNOSIS — M797 Fibromyalgia: Secondary | ICD-10-CM | POA: Insufficient documentation

## 2015-04-05 DIAGNOSIS — Z79899 Other long term (current) drug therapy: Secondary | ICD-10-CM | POA: Diagnosis not present

## 2015-04-05 DIAGNOSIS — Z8669 Personal history of other diseases of the nervous system and sense organs: Secondary | ICD-10-CM | POA: Diagnosis not present

## 2015-04-05 DIAGNOSIS — R296 Repeated falls: Secondary | ICD-10-CM | POA: Diagnosis not present

## 2015-04-05 DIAGNOSIS — R5381 Other malaise: Secondary | ICD-10-CM | POA: Diagnosis not present

## 2015-04-05 DIAGNOSIS — R079 Chest pain, unspecified: Secondary | ICD-10-CM | POA: Insufficient documentation

## 2015-04-05 LAB — BASIC METABOLIC PANEL
Anion gap: 8 (ref 5–15)
BUN: 7 mg/dL (ref 6–23)
CALCIUM: 8.9 mg/dL (ref 8.4–10.5)
CO2: 22 mmol/L (ref 19–32)
CREATININE: 0.63 mg/dL (ref 0.50–1.10)
Chloride: 103 mmol/L (ref 96–112)
GFR calc Af Amer: >90 mL/min (ref >90)
GFR calc non Af Amer: >90 mL/min (ref >90)
Glucose, Bld: 95 mg/dL (ref 70–99)
Potassium: 3.4 mmol/L — ABNORMAL LOW (ref 3.5–5.1)
Sodium: 133 mmol/L — ABNORMAL LOW (ref 135–145)

## 2015-04-05 LAB — CBC
HCT: 39 % (ref 36.0–46.0)
Hemoglobin: 13.4 g/dL (ref 12.0–15.0)
MCH: 31.3 pg (ref 26.0–34.0)
MCHC: 34.4 g/dL (ref 30.0–36.0)
MCV: 91.1 fL (ref 78.0–100.0)
Platelets: 362 10*3/uL (ref 150–400)
RBC: 4.28 MIL/uL (ref 3.87–5.11)
RDW: 12.5 % (ref 11.5–15.5)
WBC: 11.6 10*3/uL — ABNORMAL HIGH (ref 4.0–10.5)

## 2015-04-05 LAB — URINALYSIS, ROUTINE W REFLEX MICROSCOPIC
Bilirubin Urine: NEGATIVE
GLUCOSE, UA: NEGATIVE mg/dL
HGB URINE DIPSTICK: NEGATIVE
KETONES UR: NEGATIVE mg/dL
Leukocytes, UA: NEGATIVE
Nitrite: NEGATIVE
PH: 5.5 (ref 5.0–8.0)
Protein, ur: NEGATIVE mg/dL
Specific Gravity, Urine: 1.006 (ref 1.005–1.030)
Urobilinogen, UA: 0.2 mg/dL (ref 0.0–1.0)

## 2015-04-05 LAB — I-STAT TROPONIN, ED: Troponin i, poc: 0 ng/mL (ref 0.00–0.08)

## 2015-04-05 NOTE — Discharge Instructions (Signed)

## 2015-04-05 NOTE — ED Provider Notes (Signed)
CSN: [id number]     Arrival date & time 04/05/15  1304 History   First MD Initiated Contact with Patient 04/05/15 1710     Chief Complaint  Patient presents with  . Chest Pain  . Altered Mental Status  . Numbness  . Anxiety     (Consider location/radiation/quality/duration/timing/severity/associated sxs/prior Treatment) Patient is a 44 y.o. female presenting with general illness.  Illness Quality:  Confusion, anxiety, frequent falls Severity:  Moderate Onset quality:  Gradual Duration:  3 months Timing:  Constant Progression:  Worsening Chronicity:  Recurrent ("feels similar to 15 years ago when I had a nervous breakdown") Associated symptoms: chest pain, fatigue and myalgias   Associated symptoms: no abdominal pain, no loss of consciousness, no nausea, no shortness of breath and no vomiting   Associated symptoms comment:  Occasionally dizziness.  Elevated blood pressure.   Past Medical History  Diagnosis Date  . TIA (transient ischemic attack)     pt states 2 since 4/12  . Hypertension   . Anxiety   . Fibromyalgia   . Depression   . Restless leg syndrome   . Depression    Past Surgical History  Procedure Laterality Date  . Abdominal hysterectomy    . Tonsillectomy     History reviewed. No pertinent family history. History  Substance Use Topics  . Smoking status: Never Smoker   . Smokeless tobacco: Never Used  . Alcohol Use: Yes     Comment: occasional   OB History    No data available     Review of Systems  Constitutional: Positive for fatigue.  Respiratory: Negative for shortness of breath.   Cardiovascular: Positive for chest pain.  Gastrointestinal: Negative for nausea, vomiting and abdominal pain.  Genitourinary: Negative for frequency.  Musculoskeletal: Positive for myalgias.  Neurological: Negative for loss of consciousness.  All other systems reviewed and are negative.     Allergies  Oxycodone; Methadone; and Other  Home Medications    Prior to Admission medications   Medication Sig Start Date End Date Taking? Authorizing Provider  ALPRAZolam Prudy Feeler(XANAX) 1 MG tablet Take 1 mg by mouth 2 (two) times daily as needed for anxiety or sleep.  03/31/15  Yes Historical Provider, MD  aspirin EC 81 MG tablet Take 81 mg by mouth. 11/29/14 11/29/15 Yes Historical Provider, MD  desvenlafaxine (PRISTIQ) 100 MG 24 hr tablet Take 100 mg by mouth daily.   Yes Historical Provider, MD  hydrochlorothiazide (HYDRODIURIL) 25 MG tablet Take 25 mg by mouth daily.  04/01/15  Yes Historical Provider, MD  metoprolol succinate (TOPROL-XL) 100 MG 24 hr tablet Take 100 mg by mouth daily. Take with or immediately following a meal.   Yes Historical Provider, MD  VYVANSE 50 MG capsule Take 50 mg by mouth every morning. 02/17/15  Yes Historical Provider, MD  zolpidem (AMBIEN) 5 MG tablet Take 5 mg by mouth at bedtime as needed for sleep.  03/31/15  Yes Historical Provider, MD  butalbital-acetaminophen-caffeine (FIORICET) 50-325-40 MG per tablet Take 1 tablet by mouth every 6 (six) hours as needed for headache. Patient not taking: Reported on 04/05/2015 09/20/13   Joni ReiningNicole Pisciotta, PA-C  desvenlafaxine (PRISTIQ) 100 MG 24 hr tablet Take 100 mg by mouth. 02/17/15   Historical Provider, MD   BP 121/91 mmHg  Pulse 84  Temp(Src) 98.3 F (36.8 C) (Oral)  Resp 18  SpO2 100% Physical Exam  Constitutional: She is oriented to person, place, and time. She appears well-developed and well-nourished. No distress.  HENT:  Head: Normocephalic and atraumatic.  Mouth/Throat: Oropharynx is clear and moist.  Eyes: Conjunctivae are normal. Pupils are equal, round, and reactive to light. No scleral icterus.  Neck: Neck supple.  Cardiovascular: Normal rate, regular rhythm, normal heart sounds and intact distal pulses.   No murmur heard. Pulmonary/Chest: Effort normal and breath sounds normal. No stridor. No respiratory distress. She has no wheezes. She has no rales.  Abdominal: Soft.  Bowel sounds are normal. She exhibits no distension. There is no tenderness. There is no rebound and no guarding.  Musculoskeletal: Normal range of motion.  Neurological: She is alert and oriented to person, place, and time. She has normal strength. No cranial nerve deficit or sensory deficit. Coordination and gait normal. GCS eye subscore is 4. GCS verbal subscore is 5. GCS motor subscore is 6.  Skin: Skin is warm and dry. No rash noted.  Psychiatric: She has a normal mood and affect. Her behavior is normal.  Nursing note and vitals reviewed.   ED Course  Procedures (including critical care time) Labs Review Labs Reviewed  CBC - Abnormal; Notable for the following:    WBC 11.6 (*)    All other components within normal limits  BASIC METABOLIC PANEL - Abnormal; Notable for the following:    Sodium 133 (*)    Potassium 3.4 (*)    All other components within normal limits  I-STAT TROPOININ, ED    Imaging Review No results found.   EKG Interpretation   Date/Time:  Tuesday April 05 2015 13:29:10 EDT Ventricular Rate:  106 PR Interval:  173 QRS Duration: 75 QT Interval:  322 QTC Calculation: 427 R Axis:   54 Text Interpretation:  Sinus tachycardia Left atrial enlargement Low  voltage, precordial leads Probable anteroseptal infarct, old Baseline  wander in lead(s) V3 No significant change was found Confirmed by [facility]   MD, TREY ([id number]) on 04/05/2015 6:02:47 PM      MDM   Final diagnoses:  Malaise and fatigue    44 year old female with multiple vague complaints which have been present for about 3 months. She endorses feeling run down, anxious, intermittent chest pain, intermittent dizziness, elevated blood pressures. She saw her primary doctor for these issues yesterday, but was told today that her white blood cell count was elevated. This prompted her emergency room visit. On exam, well appearing, nontoxic, normal exam. Screening labs are unremarkable.  Her husband reports  that she is going to get a sleep study because he has noted apneic episodes at night.  This could explain her symptoms.  She also reports significant stress and fatigue from work, also possibly contributing to symptoms.      Blake Divine, MD 04/05/15 [phone]

## 2015-04-05 NOTE — ED Notes (Signed)
Pt states she's been extremely extremely stressed recently. States she fell at work this morning from being disoriented. Says her left arm is slightly numb, and her chest hurts. Says she took her blood pressure yesterday and found it to be 207/100. Says she had an emotional breakdown about 15 years ago and says these symptoms feel similar. Says she went to her PCP yesterday and they only told her she has elevated WBCs, but didn't elaborate.

## 2015-04-21 DIAGNOSIS — I10 Essential (primary) hypertension: Secondary | ICD-10-CM | POA: Insufficient documentation

## 2015-04-21 DIAGNOSIS — I340 Nonrheumatic mitral (valve) insufficiency: Secondary | ICD-10-CM | POA: Insufficient documentation

## 2015-05-05 ENCOUNTER — Ambulatory Visit (HOSPITAL_COMMUNITY): Payer: Managed Care, Other (non HMO) | Admitting: Physician Assistant

## 2015-08-19 ENCOUNTER — Encounter: Payer: Self-pay | Admitting: Podiatry

## 2015-08-19 ENCOUNTER — Telehealth: Payer: Self-pay | Admitting: *Deleted

## 2015-08-19 ENCOUNTER — Ambulatory Visit (INDEPENDENT_AMBULATORY_CARE_PROVIDER_SITE_OTHER): Payer: Managed Care, Other (non HMO) | Admitting: Podiatry

## 2015-08-19 VITALS — BP 116/78 | HR 95 | Resp 18

## 2015-08-19 DIAGNOSIS — M2031 Hallux varus (acquired), right foot: Secondary | ICD-10-CM

## 2015-08-19 NOTE — Patient Instructions (Signed)
Pre-Operative Instructions  Congratulations, you have decided to take an important step to improving your quality of life.  You can be assured that the doctors of [facility] will be with you every step of the way.  1. Plan to be at the surgery center/hospital at least 1 (one) hour prior to your scheduled time unless otherwise directed by the surgical center/hospital staff.  You must have a responsible adult accompany you, remain during the surgery and drive you home.  Make sure you have directions to the surgical center/hospital and know how to get there on time. 2. For hospital based surgery you will need to obtain a history and physical form from your family physician within 1 month prior to the date of surgery- we will give you a form for you primary physician.  3. We make every effort to accommodate the date you request for surgery.  There are however, times where surgery dates or times have to be moved.  We will contact you as soon as possible if a change in schedule is required.   4. No Aspirin/Ibuprofen for one week before surgery.  If you are on aspirin, any non-steroidal anti-inflammatory medications (Mobic, Aleve, Ibuprofen) you should stop taking it 7 days prior to your surgery.  You make take Tylenol  For pain prior to surgery.  5. Medications- If you are taking daily heart and blood pressure medications, seizure, reflux, allergy, asthma, anxiety, pain or diabetes medications, make sure the surgery center/hospital is aware before the day of surgery so they may notify you which medications to take or avoid the day of surgery. 6. No food or drink after midnight the night before surgery unless directed otherwise by surgical center/hospital staff. 7. No alcoholic beverages 24 hours prior to surgery.  No smoking 24 hours prior to or 24 hours after surgery. 8. Wear loose pants or shorts- loose enough to fit over bandages, boots, and casts. 9. No slip on shoes, sneakers are best. 10. Bring  your boot with you to the surgery center/hospital.  Also bring crutches or a walker if your physician has prescribed it for you.  If you do not have this equipment, it will be provided for you after surgery. 11. If you have not been contracted by the surgery center/hospital by the day before your surgery, call to confirm the date and time of your surgery. 12. Leave-time from work may vary depending on the type of surgery you have.  Appropriate arrangements should be made prior to surgery with your employer. 13. Prescriptions will be provided immediately following surgery by your doctor.  Have these filled as soon as possible after surgery and take the medication as directed. 14. Remove nail polish on the operative foot. 15. Wash the night before surgery.  The night before surgery wash the foot and leg well with the antibacterial soap provided and water paying special attention to beneath the toenails and in between the toes.  Rinse thoroughly with water and dry well with a towel.  Perform this wash unless told not to do so by your physician.  Enclosed: 1 Ice pack (please put in freezer the night before surgery)   1 Hibiclens skin cleaner   Pre-op Instructions  If you have any questions regarding the instructions, do not hesitate to call our office.  [place]: [address] [place], [place] [address] [phone]  [place]: [address] [place], [place] [address] [phone]  [place]: [address]  [place], [place] [address] [phone]  Dr. Richard   Tuchman DPM, Dr. Norman Regal DPM Dr. Richard Sikora DPM, Dr. M. Todd Hyatt DPM, Dr. Kathryn Egerton DPM, Dr. Matthew Wagoner DPM 

## 2015-08-19 NOTE — Telephone Encounter (Addendum)
"  I was there this morning and scheduled surgery for October 5th.  I was curious if I can change that to 10/26 or 11/2.  Call me back."

## 2015-08-19 NOTE — Progress Notes (Signed)
   Subjective:    Patient ID: Rebecca Mccullough, female    DOB: 1971-01-12, 44 y.o.   MRN: [id number]  HPI  We 19-year-old female presents the office today for concerns of right foot pain. She states that she presented when a bunionectomy and since the bunion surgery she hasher toes start to go inwards and she has pain in the big toe joint. She states of the big toe and second toes are separated she has pain in her big toe and the joint on the side and continues to swell. She states that when she stands for big toe does not touch the ground is looking in the air. Denies any recent injury or trauma.She has pain she tries to move her big toe joint. She has tried shoe gear changes, offloading, padding without any relief. She presents for surgical consultation from Dr. Elijah Birk. No other complaints at this time. Review of Systems  Musculoskeletal: Positive for back pain and gait problem.       JOINT AND MUSCLE PAIN AND FIBROMYALGIA  All other systems reviewed and are negative.      Objective:   Physical Exam AAO x3, NAD DP/PT pulses palpable bilaterally, CRT less than 3 seconds Protective sensation intact with Simms Weinstein monofilament, vibratory sensation intact, Achilles tendon reflex intact Is tenderness overlying the right first MTPJ and the hallux sits in an adducted position. Upon weightbearing evaluation the hallux sits dorsiflexed and adducted.there is pain and limitation of motion of the first MTPJ. Hallux varus is present. There is a scar over one area from prior surgery which is well-healed. Scar overlying the left foot from prior surgery, the toe sits in a rectus position. There is no pain to the left lower extremity. No areas of tenderness to bilateral lower extremities. MMT 5/5, ROM WNL.  No open lesions or pre-ulcerative lesions.  No overlying edema, erythema, increase in warmth to bilateral lower extremities.  No pain with calf compression, swelling, warmth, erythema bilaterally.      Assessment & Plan:  44 year old female left hallux varus with pain at the first MPJ -X-rays were obtained and reviewed with the patient.  -Treatment options discussed including all alternatives, risks, and complications -Discussed both conservative and surgical treatment options. At this time she is requesting surgical intervention. I discussed her first MPJ fusion versus implants. Given her age and the deformity is probably the implants are good option for her to discuss the MPJ fusion. She will to go ahead and proceed with this. -The incision placement as well as the postoperative course was discussed with the patient. I discussed risks of the surgery which include, but not limited to, infection, bleeding, pain, swelling, need for further surgery, delayed or nonhealing, painful or ugly scar, numbness or sensation changes, over/under correction, recurrence, transfer lesions, further deformity, hardware failure, DVT/PE, loss of toe/foot. Patient understands these risks and wishes to proceed with surgery. The surgical consent was reviewed with the patient all 3 pages were signed. No promises or guarantees were given to the outcome of the procedure. All questions were answered to the best of my ability. Before the surgery the patient was encouraged to call the office if there is any further questions. The surgery will be performed at the [facility] on an outpatient basis.  Ovid Curd, DPM

## 2015-08-22 NOTE — Telephone Encounter (Signed)
I left patient a message that we are going to go ahead and reschedule surgery to 10/19/2015.  Call if you would prefer to do it on November 2.

## 2015-09-19 ENCOUNTER — Telehealth: Payer: Self-pay | Admitting: *Deleted

## 2015-09-19 NOTE — Telephone Encounter (Signed)
"  I think I have surgery scheduled for 10/26.  I'm going to be out of town on a business meeting.  I'm going to need to reschedule that.  I'm looking at November 2 or 9.  Give me a call back and let me know."

## 2015-09-22 NOTE — Telephone Encounter (Signed)
I'm returning your call, both November 9th and 16th are available for surgery.  "Let's schedule it for the 16th."  Okay, I'll get it rescheduled.    I called and left Aram Beecham at the surgical center a message to reschedule patient's surgery from 10/19/2015 to 11/09/2015.

## 2015-10-24 ENCOUNTER — Encounter: Payer: Self-pay | Admitting: Podiatry

## 2015-11-03 ENCOUNTER — Telehealth: Payer: Self-pay | Admitting: *Deleted

## 2015-11-03 NOTE — Telephone Encounter (Signed)
I left patient a message to call me with insurance information.  Card we have on file is expired.

## 2015-11-04 ENCOUNTER — Encounter: Payer: Self-pay | Admitting: Podiatry

## 2015-11-04 NOTE — Telephone Encounter (Signed)
I attempted to call patient again to get updated insurance information.

## 2015-11-07 NOTE — Telephone Encounter (Signed)
"  I just spoke to the patient and she said she wants to hold off on doing surgery.  She said she wanted to wait until next year."  Okay, I'll give her a call.

## 2015-11-07 NOTE — Telephone Encounter (Signed)
I attempted to call patient.  I left her a message for a return call.  I want to make sure she is canceling and see if she would like to reschedule.

## 2015-11-07 NOTE — Telephone Encounter (Addendum)
"  I'm returning your call."  Yes, I spoke to Kasaanynthia at the surgical center.  She said you wanted to cancel surgery.  "Yes I want to reschedule.  I'd like to hold off until January."  Okay, we can reschedule it.  Do you have a date in mind.  "Yes, I'd like to do it January 25."  I'll get it rescheduled.  I had also called you about your insurance.  I tried to do precertification and was told insurance had terminated.  Do you have a new card?  "No, it's the same."  Can you give me the ID number, Group number and the customer service number.  "Yes, the ID is [id number], group [id number] and phone number is [phone]."

## 2015-11-14 ENCOUNTER — Encounter: Payer: Self-pay | Admitting: Podiatry

## 2016-01-16 ENCOUNTER — Telehealth: Payer: Self-pay | Admitting: *Deleted

## 2016-01-16 NOTE — Telephone Encounter (Signed)
"  It's been a while since I scheduled my surgery.  I registered for the surgery.  I still haven't heard anything about surgery.  Don't know if I needed to talk to you, doctor or what."  You are scheduled at [facility].  They normally call you a day or two prior to surgery with the arrival time.  "Is it okay for me to call them?"  Yes, you can call them at [phone].

## 2016-01-18 ENCOUNTER — Telehealth: Payer: Self-pay | Admitting: Podiatry

## 2016-01-18 ENCOUNTER — Encounter: Payer: Self-pay | Admitting: Podiatry

## 2016-01-18 DIAGNOSIS — M2041 Other hammer toe(s) (acquired), right foot: Secondary | ICD-10-CM | POA: Diagnosis not present

## 2016-01-18 DIAGNOSIS — M2011 Hallux valgus (acquired), right foot: Secondary | ICD-10-CM | POA: Diagnosis not present

## 2016-01-18 DIAGNOSIS — M2021 Hallux rigidus, right foot: Secondary | ICD-10-CM | POA: Diagnosis not present

## 2016-01-18 NOTE — Telephone Encounter (Signed)
Called patient to see how she was doing. She is doing well and has no issues. She has her prescriptions. No questions. Encouraged to call with any questions or concerns.

## 2016-01-19 ENCOUNTER — Telehealth: Payer: Self-pay | Admitting: *Deleted

## 2016-01-19 MED ORDER — HYDROMORPHONE HCL 2 MG PO TABS: ORAL_TABLET | ORAL | Status: DC

## 2016-01-19 MED ORDER — OXYCODONE-ACETAMINOPHEN 10-325 MG PO TABS: ORAL_TABLET | ORAL | Status: DC

## 2016-01-19 NOTE — Telephone Encounter (Signed)
Can also do dilaudid  PO q4-6 hours. If needing more can go up to . Disp #40. She should have the cast changed or bilvalved.

## 2016-01-19 NOTE — Telephone Encounter (Addendum)
Pt states she had surgery yesterday with Dr. Ardelle Anton and the leg block is wearing off and the Demerol  is not touching the pain.  Pt described the pain as shooting and throbbing.  I told pt to continue elevation and I would call with further instructions.  Dr. Ardelle Anton states ask pt if she can take Percocet, and make sure the cast is not too tight, if can take percocet, Percocet 10/325 order #30 1 tablet every 4-6 hours prn pain.  I spoke to pt and she said she had Percocet with her last bunion surgery, denies the cast being too tight. Orders in and pt is instructed to pick rx up in the [place] office, she states she will have her husband pick up. Pt's husband states pt is allergic to Percocet.  I had asked pt that before Dr. Ardelle Anton ordered and pt had denied.  Dr. Ardelle Anton ordered Dilaudid  #40 1 or 2 tablets every 4-6 hours prn foot pain. Orders printed and given to pt's husband.  02/13/2016-PT LEFT MESSAGE WITH NAME AND PHONE.  I left message for pt to call again with concerns, and I would get back with her.  02/14/2016-I SPOKE WITH PT, she states that she would be getting the pin out at the 1st of March, when would she begin to drive.  I told pt it depended on Dr. Ardelle Anton and her recovery but generally about 2 weeks.  02/15/2016-PT STATES SHE ONLY TAKES THE Dilaudid at night now, and would like a refill.  Dr. Ardelle Anton states refill Dilaudid 1-2mg  #40.  I informed pt she could have her husband pick up in the [place] office.

## 2016-01-27 ENCOUNTER — Encounter: Payer: Self-pay | Admitting: Podiatry

## 2016-01-27 ENCOUNTER — Ambulatory Visit (INDEPENDENT_AMBULATORY_CARE_PROVIDER_SITE_OTHER): Payer: Managed Care, Other (non HMO)

## 2016-01-27 ENCOUNTER — Ambulatory Visit (INDEPENDENT_AMBULATORY_CARE_PROVIDER_SITE_OTHER): Payer: Managed Care, Other (non HMO) | Admitting: Podiatry

## 2016-01-27 VITALS — BP 108/86 | HR 109 | Temp 98.3°F | Resp 16

## 2016-01-27 DIAGNOSIS — M2031 Hallux varus (acquired), right foot: Secondary | ICD-10-CM

## 2016-01-27 DIAGNOSIS — M2040 Other hammer toe(s) (acquired), unspecified foot: Secondary | ICD-10-CM | POA: Insufficient documentation

## 2016-01-27 DIAGNOSIS — M2030 Hallux varus (acquired), unspecified foot: Secondary | ICD-10-CM | POA: Insufficient documentation

## 2016-01-27 DIAGNOSIS — M2041 Other hammer toe(s) (acquired), right foot: Secondary | ICD-10-CM

## 2016-01-27 DIAGNOSIS — Z9889 Other specified postprocedural states: Secondary | ICD-10-CM

## 2016-01-27 MED ORDER — HYDROMORPHONE HCL 4 MG PO TABS: 4.0000 mg | ORAL_TABLET | Freq: Four times a day (QID) | ORAL | Status: DC | PRN

## 2016-01-27 NOTE — Progress Notes (Signed)
Patient ID: Rebecca Mccullough, female   DOB: [birth year], 45 y.o.   MRN: [id number]  Subjective: Rebecca Mccullough is a 45 y.o. is seen today in office s/p right 1st MTPJ arthroesis and 2nd digit hammertoe repair preformed on 01/18/16. She states her pain is improving. She does take a lot of 4 mg as needed but she has decreased the amount that she has been taking. She is asking for a refill today however. She's been trying to step her foot as much as possible. The cast is fitting well any complications. Denies any systemic complaints such as fevers, chills, nausea, vomiting. No calf pain, chest pain, shortness of breath.   Objective: General: No acute distress, AAOx3  The cast is intact and appears to be fitting well any complications. Subjectively the area is not rubbing or causing any irritation. There is an immediate capillary refill time to all the digits. Motor function is intact. There is no pain with calf compression, swelling, warmth, erythema.  Assessment and Plan:  Status post right foot surgery, doing well with no complications   -Treatment options discussed including all alternatives, risks, and complications -X-rays were obtained and reviewed with the patient.  -Will change the cast next appointment. -Aspirin daily -Ice/elevation -Pain medication as needed. Prescribed Dilaudid 4 mg today. -Monitor for any clinical signs or symptoms of infection and DVT/PE and directed to call the office immediately should any occur or go to the ER. -Follow-up in 1 week for suture removal and cast change or sooner if any problems arise. In the meantime, encouraged to call the office with any questions, concerns, change in symptoms.   Ovid Curd, DPM

## 2016-02-03 ENCOUNTER — Ambulatory Visit (INDEPENDENT_AMBULATORY_CARE_PROVIDER_SITE_OTHER): Payer: Managed Care, Other (non HMO) | Admitting: Podiatry

## 2016-02-03 VITALS — Temp 97.9°F

## 2016-02-03 DIAGNOSIS — M2041 Other hammer toe(s) (acquired), right foot: Secondary | ICD-10-CM

## 2016-02-03 DIAGNOSIS — Z9889 Other specified postprocedural states: Secondary | ICD-10-CM | POA: Diagnosis not present

## 2016-02-03 DIAGNOSIS — M2031 Hallux varus (acquired), right foot: Secondary | ICD-10-CM | POA: Diagnosis not present

## 2016-02-06 NOTE — Progress Notes (Signed)
Patient ID: Rebecca Mccullough, female   DOB: 05-20-1971, 45 y.o.   MRN: [id number]  Subjective: Rebecca Mccullough is a 45 y.o. is seen today in office s/p right 1st MTPJ arthroesis and 2nd digit hammertoe repair preformed on 01/18/16. She states that her pain has improved compared to last appointment. She has been nonweightbearing as much as possible the cast. She finished her antibiotics. Denies any systemic complaints such as fevers, chills, nausea, vomiting. No calf pain, chest pain, shortness of breath.   Objective: General: No acute distress, AAOx3  Cast intact status post right foot surgery with minimal wear on the bottom of the cast. Upon removal there is a DP/PT pulse palpable and CRT less than 3 seconds. Incisions are both well coapted without any evidence of dehiscence and sutures are intact. There is mild edema around the area however there is no swelling erythema, ascending cellulitis, fluctuance, crepitus or malodor or any other signs of infection. There is mild ecchymosis the digits. There is mild tenderness palpation of the surgical sites. No other areas of tenderness to bilateral lower extremities. There is no pain with calf compression, swelling, warmth, erythema.  Assessment and Plan:  Status post right foot surgery, doing well with no complications   -Treatment options discussed including all alternatives, risks, and complications -Cast was removed today. Every other suture removed. Antibioitc ointment was applied followed by dry sterile dressing. Keep dressing clean, dry, intact. Continue nonweightbearing. Placed into a cam boot today. -Aspirin daily -Ice/elevation -Pain medication as needed.  -Monitor for any clinical signs or symptoms of infection and DVT/PE and directed to call the office immediately should any occur or go to the ER. -Follow-up in as scheduled for suture removal or sooner if any problems arise. In the meantime, encouraged to call the office with any questions,  concerns, change in symptoms.   Ovid Curd, DPM

## 2016-02-10 ENCOUNTER — Ambulatory Visit: Payer: Self-pay

## 2016-02-10 ENCOUNTER — Ambulatory Visit (INDEPENDENT_AMBULATORY_CARE_PROVIDER_SITE_OTHER): Payer: Managed Care, Other (non HMO) | Admitting: Podiatry

## 2016-02-10 DIAGNOSIS — Z9889 Other specified postprocedural states: Secondary | ICD-10-CM

## 2016-02-10 DIAGNOSIS — M2041 Other hammer toe(s) (acquired), right foot: Secondary | ICD-10-CM

## 2016-02-10 DIAGNOSIS — M2031 Hallux varus (acquired), right foot: Secondary | ICD-10-CM

## 2016-02-13 NOTE — Progress Notes (Signed)
Patient ID: Rebecca Mccullough, female   DOB: 1971-09-20, 45 y.o.   MRN: [id number]  Subjective: Rebecca Mccullough is a 45 y.o. is seen today in office s/p right 1st MTPJ arthroesis and 2nd digit hammertoe repair preformed on 01/18/16. She states that her pain continues to improve. She has been nonweightbearing as much as possible the cast. She finished her antibiotics. Denies any systemic complaints such as fevers, chills, nausea, vomiting. No calf pain, chest pain, shortness of breath.   Objective: General: No acute distress, AAOx3  Cast intact status post right foot surgery with minimal wear on the bottom of the cast. DP/PT pulse palpable and CRT less than 3 seconds. Incisions are both well coapted without any evidence of dehiscence and remaining sutures are intact. There is mild edema around the area however there is no surrounding erythema, ascending cellulitis, fluctuance, crepitus or malodor or any other signs of infection. There is a small superficial granular, annular area which appears to be from where a bulla was present. This is adjacent to the incision. No surrounding erythema or other signs of infection. There is mild tenderness palpation of the surgical sites. No other areas of tenderness to bilateral lower extremities. There is no pain with calf compression, swelling, warmth, erythema.  Assessment and Plan:  Status post right foot surgery, doing well with no complications   -Treatment options discussed including all alternatives, risks, and complications -Remaining sutures removed without complications today. Antibiotic ointment was applied followed by a dressing. -Transition to cam boot today. She can be partial weightbearing to heal. Limit activity.  -Aspirin daily -Ice/elevation -Pain medication as needed.  -Monitor for any clinical signs or symptoms of infection and DVT/PE and directed to call the office immediately should any occur or go to the ER. -Follow-up in as scheduled or  sooner if any problems arise. In the meantime, encouraged to call the office with any questions, concerns, change in symptoms.  *likely pin removal next appointment. X-ray next appointment.    Ovid Curd, DPM

## 2016-02-15 MED ORDER — HYDROMORPHONE HCL 2 MG PO TABS: ORAL_TABLET | ORAL | Status: DC

## 2016-02-22 ENCOUNTER — Telehealth: Payer: Self-pay | Admitting: *Deleted

## 2016-02-22 NOTE — Telephone Encounter (Signed)
Pt states pin is uncomfortable when walking and and is spinning.  I told pt it would become more uncomfortable if she was up on it too much, that she should be resting.  I told pt that if the pin was to come out she should not push back in the toe, but should cover the area with an antibiotic ointment and bandaid and call our office.

## 2016-02-27 ENCOUNTER — Ambulatory Visit (INDEPENDENT_AMBULATORY_CARE_PROVIDER_SITE_OTHER): Payer: Managed Care, Other (non HMO) | Admitting: Sports Medicine

## 2016-02-27 ENCOUNTER — Encounter: Payer: Self-pay | Admitting: Sports Medicine

## 2016-02-27 ENCOUNTER — Ambulatory Visit (INDEPENDENT_AMBULATORY_CARE_PROVIDER_SITE_OTHER): Payer: Managed Care, Other (non HMO)

## 2016-02-27 DIAGNOSIS — M79671 Pain in right foot: Secondary | ICD-10-CM

## 2016-02-27 DIAGNOSIS — Z9889 Other specified postprocedural states: Secondary | ICD-10-CM | POA: Diagnosis not present

## 2016-02-27 NOTE — Progress Notes (Signed)
Patient ID: Rebecca Holtsenny L Mccullough, female   DOB: 01/03/1971, 45 y.o.   MRN: [id number] Subjective: Rebecca Mccullough is a 45 y.o. female patient seen today in office for POV, S/P right 1st MTPJ fusion and 2nd hammertoe repair; Patient is here for K-wire removal. Patient denies current pain at surgical site, admits to ocassional pain of which she takes Motrin for with relief, denies calf pain, denies headache, chest pain, shortness of breath, nausea, vomiting, fever, or chills. No other issues noted.   Patient Active Problem List   Diagnosis Date Noted  . Hallux varus, acquired 01/27/2016  . Hammertoe 01/27/2016    Current Outpatient Prescriptions on File Prior to Visit  Medication Sig Dispense Refill  . ALPRAZolam (XANAX) 1 MG tablet Take 1 mg by mouth 2 (two) times daily as needed for anxiety or sleep.     . ARIPiprazole (ABILIFY) 5 MG tablet Take 5 mg by mouth.    . butalbital-acetaminophen-caffeine (FIORICET) 50-325-40 MG per tablet Take 1 tablet by mouth every 6 (six) hours as needed for headache. 15 tablet 0  . desvenlafaxine (PRISTIQ) 100 MG 24 hr tablet Take 100 mg by mouth daily.    Marland Kitchen. desvenlafaxine (PRISTIQ) 100 MG 24 hr tablet Take 100 mg by mouth.    . diazepam (VALIUM) 5 MG tablet Take 5 mg by mouth.    . hydrochlorothiazide (HYDRODIURIL) 25 MG tablet Take 25 mg by mouth daily.     . hydrochlorothiazide (HYDRODIURIL) 25 MG tablet Take 25 mg by mouth.    Marland Kitchen. HYDROmorphone (DILAUDID) 2 MG tablet 1-2 tablets every 4-6 hours as needed for foot pain. 40 tablet 0  . metoprolol succinate (TOPROL-XL) 100 MG 24 hr tablet Take 100 mg by mouth daily. Take with or immediately following a meal.    . metoprolol succinate (TOPROL-XL) 100 MG 24 hr tablet Take 100 mg by mouth.    . Vortioxetine HBr (BRINTELLIX) 10 MG TABS Take 10 mg by mouth.    Marland Kitchen. VYVANSE 50 MG capsule Take 50 mg by mouth every morning.  0  . zolpidem (AMBIEN) 5 MG tablet Take 5 mg by mouth at bedtime as needed for sleep.   0   No current  facility-administered medications on file prior to visit.    Allergies  Allergen Reactions  . Oxycodone Hypertension and Other (See Comments)    States it spiked her BP and caused her to have TIAs States it spiked her BP and caused her to have TIAs  . Methadone Other (See Comments) and Swelling    Swelling.  . Other Swelling    A pain patch that starts with a B" A pain patch that starts with a B"    Objective: There were no vitals filed for this visit.  General: No acute distress, AAOx3  Right foot: Incision sites healed with mild scabbing, no gapping or dehiscence. Percutaneous K-wire intact to 2nd toe , mild swelling to right forefoot, no erythema, no warmth, no drainage, no signs of infection noted, Capillary fill time <3 seconds in all digits, gross sensation present via light touch to right foot. No pain or crepitation with range of motion right ankle; there is mild guarding at foot due to current post op status.  No pain with calf compression.   Post Op Xray, Right foot: Surgical hardware at 1st MTPJ and 2nd toe that is intact, Complete consolidation at 1st MTPJ is not appreciated, there is mild radiolucency at wire at 2nd toe with lateral deviation of  toe present, no acute fracture or dislocation, Soft tissue swelling within normal limits for post op status.   Assessment and Plan:  Problem List Items Addressed This Visit    None    Visit Diagnoses    Right foot pain    -  Primary    Relevant Orders    DG Foot Complete Right    Status post right foot surgery        1st MTPJ arthrodesis and 2nd hammertoe repair with Rober Minion, 01-18-16       -Patient seen and evaluated -Xrays reviewed -Removed percutaneous k-wire at right 2nd toe without incident and then applied neosporin and bandaid to pin site; advised patient to do the same at home until heals and to monitor for signs of infection - Patient may shower as normal -Dispensed darco shoe to use at all times when  ambulating -Dispensed darco toe splint to use daily at right 2nd toe -Advised patient to limit activity to necessity  -Advised patient to ice and elevate as necessary and continue with Motrin as needed for pain  -Patient to follow up with Dr. Ardelle Anton in 2 weeks. In the meantime, patient to call office if any issues or problems arise.   Asencion Islam, DPM

## 2016-03-05 NOTE — Progress Notes (Signed)
Patient ID: Rebecca Mccullough, female   DOB: [birth year], 45 y.o.   MRN: [id number] Dr Ardelle AntonWagoner performed a Rt foot 1st MPJ joint fusion, second digit hammertoe repair, hallux joint arthroplasty, cast [facility]

## 2016-03-14 ENCOUNTER — Ambulatory Visit (HOSPITAL_BASED_OUTPATIENT_CLINIC_OR_DEPARTMENT_OTHER)
Admission: RE | Admit: 2016-03-14 | Discharge: 2016-03-14 | Disposition: A | Discharge status: Home / Self Care | Payer: Managed Care, Other (non HMO) | Source: Ambulatory Visit | Attending: Podiatry | Admitting: Podiatry

## 2016-03-14 ENCOUNTER — Ambulatory Visit (INDEPENDENT_AMBULATORY_CARE_PROVIDER_SITE_OTHER): Payer: Managed Care, Other (non HMO) | Admitting: Podiatry

## 2016-03-14 ENCOUNTER — Encounter: Payer: Self-pay | Admitting: Podiatry

## 2016-03-14 VITALS — BP 134/95 | HR 104 | Resp 18

## 2016-03-14 DIAGNOSIS — M79673 Pain in unspecified foot: Secondary | ICD-10-CM

## 2016-03-14 DIAGNOSIS — Z9889 Other specified postprocedural states: Secondary | ICD-10-CM | POA: Insufficient documentation

## 2016-03-14 DIAGNOSIS — M21619 Bunion of unspecified foot: Secondary | ICD-10-CM | POA: Diagnosis not present

## 2016-03-14 DIAGNOSIS — M2031 Hallux varus (acquired), right foot: Secondary | ICD-10-CM

## 2016-03-14 DIAGNOSIS — M2041 Other hammer toe(s) (acquired), right foot: Secondary | ICD-10-CM

## 2016-03-15 NOTE — Progress Notes (Signed)
Patient ID: Rebecca Mccullough, female   DOB: [birth year], 45 y.o.   MRN: [id number]  Subjective: Rebecca Mccullough is a 45 y.o. is seen today in office s/p right 1st MTPJ arthroesis and 2nd digit hammertoe repair preformed on 01/18/16. She is remaining in the cam boot. She is trigonum back to the surgical shoe however she has not worn it as she states that she is more scared the similar step on her toe. She doesn't the surgical site is still swollen she is having some pain for which she takes Motrin. She has not required any narcotic pain medication. She denies any recent injury or trauma. She is driving and she takes the boot off the driving and she drives barefoot. Also states that her big toe is numb. No other complaints at this time. Denies any systemic complaints such as fevers, chills, nausea, vomiting. No calf pain, chest pain, shortness of breath.   Objective: General: No acute distress, AAOx3 ; presents today ambulating cam boot Right foot: Incisions are well-healed the scar is formed overlying the second toe as well as the first MTPJ. There does appear to be mild to moderate edema along the surgical site mostly along the first interspace and the first MTPJ to a lesser degree the second toe. The hallux sits in a rectus position and slightly dorsiflexed. The second toe is somewhat contracted in sitting and a dorsal lateral position. There is scar tissue present along the dorsal second MTPJ. The majority tenderness that she is expansion seems to be localized along the second toe. There is no significant pain on the first MTPJ fusion site. Subjectively there is numbness to the hallux. No other areas of tenderness to bilateral lower extremities.  There is no pain with calf compression, swelling, warmth, erythema.  Assessment and Plan:  Status post right foot surgery, doing well with no complications   -Treatment options discussed including all alternatives, risks, and complications -X-rays were ordered  and reviewed today. There does appear to be increased consolidation across the arthrodesis site and the hardware projecting from the position. The second toe is somewhat contracted. -At this time recommended continue cocoa butter vitamin E cream over the scars daily. She has not been doing this. Continue the Darco splint to help hold the second toe in a more rectus position. Hopefully continue with moisturizer will help continue to break up some scar tissue will help with the toe contracture as well. -Continue cam boot for now for comfort however she can start to transition to a surgical shoe or regular shoe as she feels able to. -Do not recommend to drive barefoot. Wear shoe. Do not drive and boot. -Although she can go back into regular shoe a surgical shoe as tolerated she should limit activities. No exercising. -Ice and elevation. -Monitor for any clinical signs or symptoms of infection and DVT/PE and directed to call the office immediately should any occur or go to the ER. -Follow-up in 3 weeks or sooner if any problems arise. In the meantime, encouraged to call the office with any questions, concerns, change in symptoms.   Ovid CurdMatthew Ertha Mccullough, DPM

## 2016-04-04 ENCOUNTER — Ambulatory Visit: Payer: Managed Care, Other (non HMO) | Admitting: Podiatry

## 2016-04-11 ENCOUNTER — Ambulatory Visit (HOSPITAL_BASED_OUTPATIENT_CLINIC_OR_DEPARTMENT_OTHER): Payer: Managed Care, Other (non HMO)

## 2016-04-11 ENCOUNTER — Ambulatory Visit (INDEPENDENT_AMBULATORY_CARE_PROVIDER_SITE_OTHER): Payer: Managed Care, Other (non HMO) | Admitting: Podiatry

## 2016-04-11 ENCOUNTER — Encounter: Payer: Self-pay | Admitting: Podiatry

## 2016-04-11 DIAGNOSIS — M2031 Hallux varus (acquired), right foot: Secondary | ICD-10-CM

## 2016-04-11 DIAGNOSIS — Z9889 Other specified postprocedural states: Secondary | ICD-10-CM

## 2016-04-11 DIAGNOSIS — M2041 Other hammer toe(s) (acquired), right foot: Secondary | ICD-10-CM

## 2016-04-15 NOTE — Progress Notes (Signed)
Patient ID: Rebecca Mccullough, female   DOB: 01-06-1971, 45 y.o.   MRN: [id number]  Subjective: Rebecca Mccullough is a 45 y.o. is seen today in office s/p right 1st MTPJ arthroesis and 2nd digit hammertoe repair preformed on 01/18/16. After last point she remained the surgical shoe for 1 week before returning back to a regular shoe. She presents in a regular sneaker. She said that she does well until later in the at and she starts to get some swelling and discomfort on the surgical site. Majority of the second toe. She is having no pain of the big toe. Denies any recent injury or trauma. Swelling has improved.  Denies any systemic complaints such as fevers, chills, nausea, vomiting. No calf pain, chest pain, shortness of breath.   Objective: General: No acute distress, AAOx3 ; presents today ambulating cam boot Right foot: Incisions are well-healed the scar is formed overlying the second toe as well as the first MTPJ. There is decreased edema along the surgical sites. First MTPJ is stable. Second toe does sit slightly elevated however it is more rectus position compared to last appointment. Range of motion intact to the MTPJ. There is mild discomfort on the second toe how there is no significant discomfort along the first MTPJ. There is no other areas of tenderness to bilateral lower extremity is. There is no open lesions or pre-ulcerative lesions. There is no pain with calf compression, swelling, warmth, erythema.  Assessment and Plan:  Status post right foot surgery, doing well with no complications   -Treatment options discussed including all alternatives, risks, and complications -Patient declined x-rays today. -At this time continue regular, supportive shoe gear. Gradually increase activities as tolerated. Continue the second MTPJ range of motion exercises. Continue with splinting of the second toe. I'll see her back in 6 weeks. X-ray next appointment. In the meantime encouraged to call the office with  any questions, concerns, change in symptoms.  Ovid CurdMatthew Mc Hollen, DPM

## 2016-05-30 ENCOUNTER — Ambulatory Visit: Payer: Managed Care, Other (non HMO) | Admitting: Podiatry

## 2016-07-02 ENCOUNTER — Encounter: Payer: Self-pay | Admitting: Podiatry

## 2016-07-02 ENCOUNTER — Ambulatory Visit (INDEPENDENT_AMBULATORY_CARE_PROVIDER_SITE_OTHER): Payer: Managed Care, Other (non HMO)

## 2016-07-02 ENCOUNTER — Ambulatory Visit (INDEPENDENT_AMBULATORY_CARE_PROVIDER_SITE_OTHER): Payer: Managed Care, Other (non HMO) | Admitting: Podiatry

## 2016-07-02 DIAGNOSIS — M79671 Pain in right foot: Secondary | ICD-10-CM

## 2016-07-02 DIAGNOSIS — S92901K Unspecified fracture of right foot, subsequent encounter for fracture with nonunion: Secondary | ICD-10-CM | POA: Diagnosis not present

## 2016-07-02 DIAGNOSIS — Z9889 Other specified postprocedural states: Secondary | ICD-10-CM

## 2016-07-02 NOTE — Progress Notes (Signed)
Patient ID: Rebecca Mccullough, female   DOB: May 09, 1971, 45 y.o.   MRN: [id number]  Subjective: 45 year old female presents to the office today for concerns of her right foot big toe joint swelling and pain on the top of the foot along the surgical site. She states the pain to the 2nd toe has improved quite a bit however. She has difficulty with heeled shoes. She states it gets more swollen toward the end of the day.  Denies any systemic complaints such as fevers, chills, nausea, vomiting. No acute changes since last appointment, and no other complaints at this time.   Objective: AAO x3, NAD; presents today wearing sandals. DP/PT pulses palpable bilaterally, CRT less than 3 seconds Protective sensation intact with Simms Weinstein monofilament, vibratory sensation intact, Achilles tendon reflex intact Incision along the first MPJ and the second toe is well-healed with scar. The first MPJ arthrodesis site is stable. There is some localized edema on the proximal phalanx. The hardware is not palpable however upon palpation this areas where she gets discomfort. The second toe does sit in a slightly lateral and dorsiflexed position how there is no pain. This is contracting little the MPJ. No areas of pinpoint bony tenderness or pain with vibratory sensation. MMT 5/5, ROM WNL. No edema, erythema, increase in warmth to bilateral lower extremities.  No open lesions or pre-ulcerative lesions.  No pain with calf compression, swelling, warmth, erythema  Assessment: Left first MTPJ pain likely due to hardware, nonunion arthrodesis site.  Plan: -All treatment options discussed with the patient including all alternatives, risks, complications.  -X-rays obtained and reviewed the patient. There is a radiolucent line on the first MPJ arthrodesis site consistent with a nonunion. Hardware intact. -At this time a discussed the bone stimulator given the nonunion. This is ordered today. -I discussed with her next couple  months possible hardware removal. She would hold off to do this until the fall. -Continue with supportive shoe gear. Also diiscuss orthotics -Continue ibuprofen as needed. -Follow-up in 6 weeks or sooner if any issues are to arise. -Patient encouraged to call the office with any questions, concerns, change in symptoms.   Ovid CurdMatthew Wagoner, DPM

## 2016-07-17 ENCOUNTER — Telehealth: Payer: Self-pay | Admitting: *Deleted

## 2016-07-17 NOTE — Telephone Encounter (Signed)
Prior approval for Exogen bone growth stimulator, Reference code:  [id number], authorization effective date(s): 07/03/2016 through 10/03/2016.

## 2016-08-13 ENCOUNTER — Ambulatory Visit: Payer: Managed Care, Other (non HMO) | Admitting: Podiatry

## 2016-09-03 ENCOUNTER — Ambulatory Visit: Payer: Managed Care, Other (non HMO) | Admitting: Podiatry

## 2016-09-10 ENCOUNTER — Ambulatory Visit: Payer: Managed Care, Other (non HMO) | Admitting: Podiatry

## 2016-09-21 DIAGNOSIS — F331 Major depressive disorder, recurrent, moderate: Secondary | ICD-10-CM | POA: Insufficient documentation

## 2016-09-21 DIAGNOSIS — F411 Generalized anxiety disorder: Secondary | ICD-10-CM

## 2016-09-21 DIAGNOSIS — I479 Paroxysmal tachycardia, unspecified: Secondary | ICD-10-CM | POA: Insufficient documentation

## 2016-09-21 DIAGNOSIS — M797 Fibromyalgia: Secondary | ICD-10-CM | POA: Insufficient documentation

## 2016-09-21 DIAGNOSIS — F410 Panic disorder [episodic paroxysmal anxiety] without agoraphobia: Secondary | ICD-10-CM | POA: Insufficient documentation

## 2016-10-01 ENCOUNTER — Emergency Department (HOSPITAL_COMMUNITY)
Admission: EM | Admit: 2016-10-01 | Discharge: 2016-10-01 | Disposition: A | Discharge status: Home / Self Care | Payer: Managed Care, Other (non HMO) | Attending: Emergency Medicine | Admitting: Emergency Medicine

## 2016-10-01 ENCOUNTER — Encounter (HOSPITAL_COMMUNITY): Payer: Self-pay | Admitting: Emergency Medicine

## 2016-10-01 DIAGNOSIS — F419 Anxiety disorder, unspecified: Secondary | ICD-10-CM

## 2016-10-01 DIAGNOSIS — I10 Essential (primary) hypertension: Secondary | ICD-10-CM | POA: Diagnosis not present

## 2016-10-01 DIAGNOSIS — F418 Other specified anxiety disorders: Secondary | ICD-10-CM | POA: Diagnosis not present

## 2016-10-01 DIAGNOSIS — F410 Panic disorder [episodic paroxysmal anxiety] without agoraphobia: Secondary | ICD-10-CM | POA: Diagnosis present

## 2016-10-01 DIAGNOSIS — Z8673 Personal history of transient ischemic attack (TIA), and cerebral infarction without residual deficits: Secondary | ICD-10-CM | POA: Diagnosis not present

## 2016-10-01 DIAGNOSIS — F329 Major depressive disorder, single episode, unspecified: Secondary | ICD-10-CM

## 2016-10-01 DIAGNOSIS — F32A Depression, unspecified: Secondary | ICD-10-CM

## 2016-10-01 DIAGNOSIS — Z79899 Other long term (current) drug therapy: Secondary | ICD-10-CM | POA: Diagnosis not present

## 2016-10-01 LAB — BASIC METABOLIC PANEL
ANION GAP: 5 (ref 5–15)
BUN: 7 mg/dL (ref 6–20)
CALCIUM: 8.6 mg/dL — AB (ref 8.9–10.3)
CO2: 24 mmol/L (ref 22–32)
Chloride: 110 mmol/L (ref 101–111)
Creatinine, Ser: 0.56 mg/dL (ref 0.44–1.00)
Glucose, Bld: 87 mg/dL (ref 65–99)
POTASSIUM: 3.6 mmol/L (ref 3.5–5.1)
SODIUM: 139 mmol/L (ref 135–145)

## 2016-10-01 LAB — CBC WITH DIFFERENTIAL/PLATELET
BASOS ABS: 0 10*3/uL (ref 0.0–0.1)
BASOS PCT: 0 %
EOS PCT: 1 %
Eosinophils Absolute: 0.1 10*3/uL (ref 0.0–0.7)
HEMATOCRIT: 37.7 % (ref 36.0–46.0)
Hemoglobin: 13.1 g/dL (ref 12.0–15.0)
Lymphocytes Relative: 25 %
Lymphs Abs: 2.3 10*3/uL (ref 0.7–4.0)
MCH: 31.7 pg (ref 26.0–34.0)
MCHC: 34.7 g/dL (ref 30.0–36.0)
MCV: 91.3 fL (ref 78.0–100.0)
MONO ABS: 0.6 10*3/uL (ref 0.1–1.0)
MONOS PCT: 6 %
NEUTROS ABS: 6.4 10*3/uL (ref 1.7–7.7)
NEUTROS PCT: 68 %
PLATELETS: 359 10*3/uL (ref 150–400)
RBC: 4.13 MIL/uL (ref 3.87–5.11)
RDW: 12.5 % (ref 11.5–15.5)
WBC: 9.4 10*3/uL (ref 4.0–10.5)

## 2016-10-01 LAB — RAPID URINE DRUG SCREEN, HOSP PERFORMED
Amphetamines: NOT DETECTED
BARBITURATES: NOT DETECTED
Benzodiazepines: POSITIVE — AB
COCAINE: NOT DETECTED
Opiates: NOT DETECTED
Tetrahydrocannabinol: POSITIVE — AB

## 2016-10-01 LAB — I-STAT BETA HCG BLOOD, ED (MC, WL, AP ONLY): I-stat hCG, quantitative: <5 m[IU]/mL (ref <5)

## 2016-10-01 MED ORDER — METOPROLOL SUCCINATE ER 100 MG PO TB24: 100.0000 mg | ORAL_TABLET | Freq: Every day | ORAL | Status: DC

## 2016-10-01 MED ORDER — IBUPROFEN 200 MG PO TABS
600.0000 mg | ORAL_TABLET | Freq: Three times a day (TID) | ORAL | Status: DC | PRN
  Administered 2016-10-01: 600 mg via ORAL
  Filled 2016-10-01: qty 3

## 2016-10-01 MED ORDER — LORAZEPAM 2 MG/ML IJ SOLN
2.0000 mg | Freq: Once | INTRAMUSCULAR | Status: AC
  Administered 2016-10-01: 2 mg via INTRAMUSCULAR
  Filled 2016-10-01: qty 1

## 2016-10-01 MED ORDER — ZOLPIDEM TARTRATE 5 MG PO TABS: 5.0000 mg | ORAL_TABLET | Freq: Every evening | ORAL | Status: DC | PRN

## 2016-10-01 MED ORDER — ACETAMINOPHEN 325 MG PO TABS: 650.0000 mg | ORAL_TABLET | ORAL | Status: DC | PRN

## 2016-10-01 MED ORDER — NICOTINE 21 MG/24HR TD PT24: 21.0000 mg | MEDICATED_PATCH | Freq: Every day | TRANSDERMAL | Status: DC | PRN

## 2016-10-01 MED ORDER — HYDROCHLOROTHIAZIDE 25 MG PO TABS: 25.0000 mg | ORAL_TABLET | Freq: Every day | ORAL | Status: DC

## 2016-10-01 MED ORDER — VENLAFAXINE HCL ER 150 MG PO CP24: 150.0000 mg | ORAL_CAPSULE | Freq: Every day | ORAL | Status: DC

## 2016-10-01 MED ORDER — CLONAZEPAM 0.5 MG PO TABS: 0.5000 mg | ORAL_TABLET | Freq: Three times a day (TID) | ORAL | Status: DC | PRN

## 2016-10-01 MED ORDER — ALUM & MAG HYDROXIDE-SIMETH 200-200-20 MG/5ML PO SUSP: 30.0000 mL | ORAL | Status: DC | PRN

## 2016-10-01 MED ORDER — ARIPIPRAZOLE 5 MG PO TABS
5.0000 mg | ORAL_TABLET | Freq: Every day | ORAL | Status: DC
  Administered 2016-10-01: 5 mg via ORAL
  Filled 2016-10-01: qty 1

## 2016-10-01 MED ORDER — AMITRIPTYLINE HCL 25 MG PO TABS: 25.0000 mg | ORAL_TABLET | Freq: Every evening | ORAL | Status: DC | PRN

## 2016-10-01 MED ORDER — ONDANSETRON HCL 4 MG PO TABS: 4.0000 mg | ORAL_TABLET | Freq: Three times a day (TID) | ORAL | Status: DC | PRN

## 2016-10-01 NOTE — ED Notes (Signed)
TTS completed, family at bedside.

## 2016-10-01 NOTE — BH Assessment (Signed)
Assessor spoke with Rashell, RN to set up TTS in pt's room for consult.  Princess BruinsAquicha Duff, MSW, Theresia MajorsLCSWA

## 2016-10-01 NOTE — Discharge Instructions (Signed)
Follow-up for treatment is recommended by the TTS counselor.

## 2016-10-01 NOTE — ED Notes (Signed)
As per Marquita PalmsAkeysha, pt does not meet in patient criteria.  Out patient resources given.

## 2016-10-01 NOTE — ED Notes (Signed)
Husband at bedside to drive pt home.

## 2016-10-01 NOTE — ED Provider Notes (Addendum)
[facility]-EMERGENCY DEPT Provider Note   CSN: [id number] Arrival date & time: 10/01/16  1641     History   Chief Complaint Chief Complaint  Patient presents with  . Panic Attack    HPI Rebecca Mccullough is a 45 y.o. female.  She presents for worsening anxiety, with depression, which makes it difficult to "remember anything". She saw her PCP today, and because of her desire to be "admitted." She was sent here. Last week her Xanax was changed to Klonopin and her Pristiq dosage was doubled from 50-100 mg. She denies suicidal or homicidal ideation. She was unable to work today because of her distress. She is here, by EMS, accompanied by her husband. She denies recent illnesses. She states that she has a history of "TIA", but no treatment was given. There are no other no modifying factors.  HPI  Past Medical History:  Diagnosis Date  . Anxiety   . Depression   . Depression   . Fibromyalgia   . Hypertension   . Restless leg syndrome   . TIA (transient ischemic attack)    pt states 2 since 4/12    Patient Active Problem List   Diagnosis Date Noted  . Hallux varus, acquired 01/27/2016  . Hammertoe 01/27/2016    Past Surgical History:  Procedure Laterality Date  . ABDOMINAL HYSTERECTOMY    . TONSILLECTOMY      OB History    No data available       Home Medications    Prior to Admission medications   Medication Sig Start Date End Date Taking? Authorizing Provider  ALPRAZolam Prudy Feeler) 1 MG tablet Take 1 mg by mouth 2 (two) times daily as needed for anxiety or sleep.  03/31/15   Historical Provider, MD  amitriptyline (ELAVIL) 25 MG tablet Take by mouth. 03/15/14   Historical Provider, MD  ARIPiprazole (ABILIFY) 5 MG tablet Take 5 mg by mouth. 06/13/15   Historical Provider, MD  butalbital-acetaminophen-caffeine (FIORICET) 50-325-40 MG per tablet Take 1 tablet by mouth every 6 (six) hours as needed for headache. 09/20/13   Joni Reining Pisciotta, PA-C  cephALEXin (KEFLEX) 500 MG capsule  Take 500 mg by mouth 4 (four) times daily.    Vivi Barrack, DPM  Cholecalciferol (VITAMIN D3) 50000 units CAPS TAKE 1 CAPSULE BY MOUTH EVERY WEEK FOR 30 DAYS 01/02/16   Historical Provider, MD  Cholecalciferol (VITAMIN D3) 50000 units CAPS  10/21/15   Historical Provider, MD  cyanocobalamin (,VITAMIN B-12,) 1000 MCG/ML injection  02/05/16   Historical Provider, MD  desvenlafaxine (PRISTIQ) 100 MG 24 hr tablet Take 100 mg by mouth daily.    Historical Provider, MD  desvenlafaxine (PRISTIQ) 100 MG 24 hr tablet Take 100 mg by mouth. 02/17/15   Historical Provider, MD  diazepam (VALIUM) 5 MG tablet Take 5 mg by mouth. 06/13/15   Historical Provider, MD  hydrochlorothiazide (HYDRODIURIL) 25 MG tablet Take 25 mg by mouth daily.  04/01/15   Historical Provider, MD  hydrochlorothiazide (HYDRODIURIL) 25 MG tablet Take 25 mg by mouth. 07/25/15 07/24/16  Historical Provider, MD  HYDROmorphone (DILAUDID) 2 MG tablet 1-2 tablets every 4-6 hours as needed for foot pain. 02/15/16   Vivi Barrack, DPM  hydrOXYzine (ATARAX/VISTARIL) 25 MG tablet Take 25 mg by mouth. 03/15/14   Historical Provider, MD  meperidine (DEMEROL) 50 MG tablet TAKE 1 TABLET EVERY 4 TO 6 HOURS AS NEEDED FOR PAIN 01/18/16   Historical Provider, MD  metoprolol succinate (TOPROL-XL) 100 MG 24 hr tablet Take  100 mg by mouth daily. Take with or immediately following a meal.    Historical Provider, MD  metoprolol succinate (TOPROL-XL) 100 MG 24 hr tablet Take 100 mg by mouth. 04/28/15   Historical Provider, MD  pentosan polysulfate (ELMIRON) 100 MG capsule Take 200 mg by mouth. 03/15/14   Historical Provider, MD  Phentermine-Topiramate (QSYMIA) 7.5-46 MG CP24 Take by mouth. 11/23/15   Historical Provider, MD  promethazine (PHENERGAN) 12.5 MG tablet TAKE ONE TABLET BY MOUTH EVERY 6 HOURS AS NEEDED FOR NAUSEA/VOMITING 01/18/16   Historical Provider, MD  solifenacin (VESICARE) 10 MG tablet Take 10 mg by mouth. 03/15/14   Historical Provider, MD  valACYclovir  (VALTREX) 1000 MG tablet  10/21/15   Historical Provider, MD  valACYclovir (VALTREX) 1000 MG tablet  02/06/16   Historical Provider, MD  Vortioxetine HBr (BRINTELLIX) 10 MG TABS Take 10 mg by mouth. 06/13/15   Historical Provider, MD  VYVANSE 50 MG capsule Take 50 mg by mouth every morning. 02/17/15   Historical Provider, MD  zolpidem (AMBIEN) 5 MG tablet Take 5 mg by mouth at bedtime as needed for sleep.  03/31/15   Historical Provider, MD    Family History No family history on file.  Social History Social History  Substance Use Topics  . Smoking status: Never Smoker  . Smokeless tobacco: Never Used  . Alcohol use Yes     Comment: occasional     Allergies   Oxycodone; Methadone; and Other   Review of Systems Review of Systems  All other systems reviewed and are negative.    Physical Exam Updated Vital Signs BP 126/87   Pulse 78   Temp 98.1 F (36.7 C) (Oral)   Resp 16   SpO2 95%   Physical Exam  Constitutional: She is oriented to person, place, and time. She appears well-developed and well-nourished. She appears distressed (She is uncomfortable).  HENT:  Head: Normocephalic and atraumatic.  Eyes: Conjunctivae and EOM are normal. Pupils are equal, round, and reactive to light.  Neck: Normal range of motion and phonation normal. Neck supple.  Cardiovascular: Normal rate and regular rhythm.   Pulmonary/Chest: Effort normal and breath sounds normal. She exhibits no tenderness.  Abdominal: Soft. She exhibits no distension. There is no tenderness. There is no guarding.  Musculoskeletal: Normal range of motion.  Neurological: She is alert and oriented to person, place, and time. She exhibits normal muscle tone.  Skin: Skin is warm and dry.  Psychiatric: Her behavior is normal. Judgment and thought content normal.  Crying, depressed  Nursing note and vitals reviewed.    ED Treatments / Results  Labs (all labs ordered are listed, but only abnormal results are  displayed) Labs Reviewed  RAPID URINE DRUG SCREEN, HOSP PERFORMED - Abnormal; Notable for the following:       Result Value   Benzodiazepines POSITIVE (*)    Tetrahydrocannabinol POSITIVE (*)    All other components within normal limits  BASIC METABOLIC PANEL - Abnormal; Notable for the following:    Calcium 8.6 (*)    All other components within normal limits  CBC WITH DIFFERENTIAL/PLATELET  I-STAT BETA HCG BLOOD, ED ([facility], [facility], [facility] ONLY)    EKG  EKG Interpretation None       Radiology No results found.  Procedures Procedures (including critical care time)  Medications Ordered in ED Medications  acetaminophen (TYLENOL) tablet 650 mg (not administered)  ibuprofen (ADVIL,MOTRIN) tablet 600 mg (not administered)  zolpidem (AMBIEN) tablet 5 mg (not administered)  nicotine (NICODERM CQ - dosed in mg/24 hours) patch 21 mg (not administered)  ondansetron (ZOFRAN) tablet 4 mg (not administered)  alum & mag hydroxide-simeth (MAALOX/MYLANTA) 200-200-20 MG/5ML suspension 30 mL (not administered)     Initial Impression / Assessment and Plan / ED Course  I have reviewed the triage vital signs and the nursing notes.  Pertinent labs & imaging results that were available during my care of the patient were reviewed by me and considered in my medical decision making (see chart for details).  Clinical Course  Comment By Time  At this time; se is medically cleared for treatment by psychiatry. Mancel BaleElliott Marthe Dant, MD 10/09 1918    Medications  acetaminophen (TYLENOL) tablet 650 mg (not administered)  ibuprofen (ADVIL,MOTRIN) tablet 600 mg (not administered)  zolpidem (AMBIEN) tablet 5 mg (not administered)  nicotine (NICODERM CQ - dosed in mg/24 hours) patch 21 mg (not administered)  ondansetron (ZOFRAN) tablet 4 mg (not administered)  alum & mag hydroxide-simeth (MAALOX/MYLANTA) 200-200-20 MG/5ML suspension 30 mL (not administered)    Patient Vitals for the past 24 hrs:  BP Temp Temp src  Pulse Resp SpO2  10/01/16 1859 126/87 - - 78 - 95 %  10/01/16 1652 144/94 98.1 F (36.7 C) Oral 97 16 96 %  10/01/16 1649 (!) 128/103 98.3 F (36.8 C) Oral 91 18 97 %    7:19 PM Reevaluation with update and discussion. After initial assessment and treatment, an updated evaluation reveals no change in clinical status. Ilina Xu L   TTS Consultation  Final Clinical Impressions(s) / ED Diagnoses   Final diagnoses:  Anxiety disorder, unspecified type  Depression, unspecified depression type    Anxiety and depression, failing outpatient treatment, with change of dosage and medication. No suicidal or homicidal ideation. Patient is seeking hospitalization.  Nursing Notes Reviewed/ Care Coordinated Applicable Imaging Reviewed Interpretation of Laboratory Data incorporated into ED treatment  Plan: as per TTS in conjunction with oncoming provider team.  22:15- she has been seen by TTS, who felt that she was stable for discharge, and gave her outpatient resources for follow-up care. At this time. The patient is comfortable, stating she feels better and is ready to go home.  New Prescriptions New Prescriptions   No medications on file     Mancel BaleElliott Tyrus Wilms, MD 10/01/16 1921    Mancel BaleElliott Almira Phetteplace, MD 10/01/16 2231

## 2016-10-01 NOTE — ED Notes (Addendum)
Pt presents with anxiety and depression increasing in severity.  Pt reports MD adjusted her Pristiq meds, but she continues to feel overwhelmed, anxious and tearful.  Pt reports she has been experiencing panic attacks for past 4-5 days.  Denies SI, HI or AVH.  Monitoring for safety, Q 15 min checks in effect.

## 2016-10-01 NOTE — ED Notes (Signed)
Patient has been wanded by security, Patients family is going to take belongings with them

## 2016-10-01 NOTE — BH Assessment (Signed)
Tele Assessment Note   Rebecca Mccullough is an 45 y.o. female who presents to the ED voluntarily. Pt reports she was at her doctor's office and began having a panic attack. Pt reports she has been having 3-4 panic attacks each day for the past several weeks. When asked for pt's perception of what is triggering the panic attacks she stated, "they just come and go and I really don't know just stress at work and 2 kids at home." Pt denies S/I , H/I , and A/V hallucination. Pt reports a recent change in her medication that was prescribed by her primary care doctor and she reports this may be a contributing factor to her panic attacks. Pt reports she used to see a therapist several years ago but she does not recall the name however she states it was at the [facility] in [place], [place]. Pt denies prior inpt hospitalizations. Pt endorses some depressive symptoms including poor appetite, isolating, and a lack of desire to get out of bed. Pt denies drug use or past history of trauma related to physical or sexual abuse.  Per Donell Sievert, PA pt does not meet inpt criteria. OPT resources is the recommendation. Ronnell Freshwater, RN has been notified and pt has been faxed resources to follow up with OPT.  Diagnosis: Generalized Anxiety Disorder   Past Medical History:  Past Medical History:  Diagnosis Date   Anxiety    Depression    Depression    Fibromyalgia    Hypertension    Restless leg syndrome    TIA (transient ischemic attack)    pt states 2 since 4/12    Past Surgical History:  Procedure Laterality Date   ABDOMINAL HYSTERECTOMY     TONSILLECTOMY      Family History: No family history on file.  Social History:  reports that she has never smoked. She has never used smokeless tobacco. She reports that she drinks alcohol. She reports that she does not use drugs.  Additional Social History:  Alcohol / Drug Use Pain Medications: Pt denies abuse Prescriptions: Pt denies  abuse Over the Counter: Pt denies abuse History of alcohol / drug use?: No history of alcohol / drug abuse  CIWA: CIWA-Ar BP: 126/87 Pulse Rate: 78 COWS:    PATIENT STRENGTHS: (choose at least two) Average or above average intelligence Capable of independent living Licensed conveyancer Motivation for treatment/growth Supportive family/friends  Allergies:  Allergies  Allergen Reactions   Oxycodone Hypertension and Other (See Comments)    States it spiked her BP and caused her to have TIAs States it spiked her BP and caused her to have TIAs   Methadone Other (See Comments) and Swelling    Swelling.   Other Swelling    A pain patch that starts with a B" A pain patch that starts with a B"    Home Medications:  (Not in a hospital admission)  OB/GYN Status:  No LMP recorded. Patient has had a hysterectomy.  General Assessment Data Location of Assessment: [facility] TTS Assessment: In system Is this a Tele or Face-to-Face Assessment?: Tele Assessment Is this an Initial Assessment or a Re-assessment for this encounter?: Initial Assessment Marital status: Married Mims name: Lafe Garin Is patient pregnant?: No Pregnancy Status: No Living Arrangements: Children, Spouse/significant other Can pt return to current living arrangement?: Yes Admission Status: Voluntary Is patient capable of signing voluntary admission?: Yes Referral Source: MD Insurance type: [employer]     Crisis Care Plan Living  Arrangements: Children, Spouse/significant other Name of Psychiatrist: none provided Name of Therapist: none provided  Education Status Is patient currently in school?: No Highest grade of school patient has completed: 4 years of college  Risk to self with the past 6 months Suicidal Ideation: No Has patient been a risk to self within the past 6 months prior to admission? : No Suicidal Intent: No Has patient had any suicidal intent within the past 6 months prior to  admission? : No Is patient at risk for suicide?: No Suicidal Plan?: No Has patient had any suicidal plan within the past 6 months prior to admission? : No Access to Means: No What has been your use of drugs/alcohol within the last 12 months?: denies Previous Attempts/Gestures: No Triggers for Past Attempts: None known Intentional Self Injurious Behavior: None Family Suicide History: No Recent stressful life event(s): Other (Comment) (pt continued to say "work stress") Persecutory voices/beliefs?: No Depression: Yes Depression Symptoms: Insomnia, Loss of interest in usual pleasures Substance abuse history and/or treatment for substance abuse?: No Suicide prevention information given to non-admitted patients: Not applicable  Risk to Others within the past 6 months Homicidal Ideation: No Does patient have any lifetime risk of violence toward others beyond the six months prior to admission? : No Thoughts of Harm to Others: No Current Homicidal Intent: No Current Homicidal Plan: No Access to Homicidal Means: No History of harm to others?: No Assessment of Violence: None Noted Does patient have access to weapons?: Yes (Comment) (pt reports husband has a gun safe) Criminal Charges Pending?: No Does patient have a court date: No Is patient on probation?: No  Psychosis Hallucinations: None noted Delusions: None noted  Mental Status Report Appearance/Hygiene: Unremarkable, In scrubs Eye Contact: Good Motor Activity: Freedom of movement Speech: Logical/coherent Level of Consciousness: Alert Mood: Anxious Affect: Anxious Anxiety Level: Moderate Thought Processes: Coherent, Relevant Judgement: Partial Orientation: Place, Person, Situation, Time, Appropriate for developmental age Obsessive Compulsive Thoughts/Behaviors: None  Cognitive Functioning Concentration: Normal Memory: Recent Intact, Remote Impaired IQ: Average Insight: Fair Impulse Control: Fair Appetite:  Poor Sleep: No Change Total Hours of Sleep: 5 Vegetative Symptoms: Staying in bed, Decreased grooming  ADLScreening [facility] Assessment Services) Patient's cognitive ability adequate to safely complete daily activities?: Yes Patient able to express need for assistance with ADLs?: Yes Independently performs ADLs?: Yes (appropriate for developmental age)  Prior Inpatient Therapy Prior Inpatient Therapy: No  Prior Outpatient Therapy Prior Outpatient Therapy: Yes Prior Therapy Dates: pt unsure Prior Therapy Facilty/Provider(s): [facility] Reason for Treatment: depression/anxiety Does patient have an ACCT team?: No Does patient have Intensive In-House Services?  : No Does patient have [facility] services? : No Does patient have P4CC services?: No  ADL Screening (condition at time of admission) Patient's cognitive ability adequate to safely complete daily activities?: Yes Is the patient deaf or have difficulty hearing?: No Does the patient have difficulty seeing, even when wearing glasses/contacts?: No Does the patient have difficulty concentrating, remembering, or making decisions?: No Patient able to express need for assistance with ADLs?: Yes Does the patient have difficulty dressing or bathing?: No Independently performs ADLs?: Yes (appropriate for developmental age) Does the patient have difficulty walking or climbing stairs?: No Weakness of Legs: None Weakness of Arms/Hands: None  Home Assistive Devices/Equipment Home Assistive Devices/Equipment: None    Abuse/Neglect Assessment (Assessment to be complete while patient is alone) Physical Abuse: Denies Verbal Abuse: Denies Sexual Abuse: Denies Exploitation of patient/patient's resources: Denies Self-Neglect: Denies     Advance  Directives (For Healthcare) Does patient have an advance directive?: No Would patient like information on creating an advanced directive?: No - patient declined information    Additional  Information 1:1 In Past 12 Months?: No CIRT Risk: No Elopement Risk: No Does patient have medical clearance?: Yes     Disposition:  Disposition Initial Assessment Completed for this Encounter: Yes Disposition of Patient: Other dispositions Other disposition(s): Other (Comment) (OPT resources per Donell Sievert, PA )  Karolee Ohs 10/01/2016 8:47 PM

## 2016-10-01 NOTE — ED Triage Notes (Addendum)
Per EMS, Pt from doctor's office after having a panic attack. PT has hx of anxiety and depression. Pt had medication changed last week and pt sts she has felt worse since. Doctor's office sent pt over because she had a panic attack. Pt has calmed down since doctor's office but is still anxious. Denies SI/HI. Pt has been having panic attacks 4-5 times a day for a couple of weeks.

## 2017-12-25 DIAGNOSIS — F5101 Primary insomnia: Secondary | ICD-10-CM | POA: Insufficient documentation

## 2018-02-27 DIAGNOSIS — E669 Obesity, unspecified: Secondary | ICD-10-CM | POA: Insufficient documentation

## 2018-03-06 DIAGNOSIS — E559 Vitamin D deficiency, unspecified: Secondary | ICD-10-CM | POA: Insufficient documentation

## 2018-05-13 ENCOUNTER — Telehealth: Payer: Self-pay | Admitting: Podiatry

## 2018-05-13 NOTE — Telephone Encounter (Signed)
I told pt our office did not prescribe pain medication prior to seeing the pt.

## 2018-05-13 NOTE — Telephone Encounter (Signed)
Patient called to see if she can get an earlier appt I was able to get her in this Friday 05/16/18 but she also wanted to know if she can be prescribed any pain medication until then because she's in extreme pain. If you can give her a call back at [phone]

## 2018-05-14 ENCOUNTER — Ambulatory Visit (INDEPENDENT_AMBULATORY_CARE_PROVIDER_SITE_OTHER): Payer: Managed Care, Other (non HMO)

## 2018-05-14 ENCOUNTER — Encounter: Payer: Self-pay | Admitting: Podiatry

## 2018-05-14 ENCOUNTER — Ambulatory Visit: Payer: Managed Care, Other (non HMO) | Admitting: Podiatry

## 2018-05-14 DIAGNOSIS — S92901K Unspecified fracture of right foot, subsequent encounter for fracture with nonunion: Secondary | ICD-10-CM | POA: Diagnosis not present

## 2018-05-14 DIAGNOSIS — M2011 Hallux valgus (acquired), right foot: Secondary | ICD-10-CM

## 2018-05-14 DIAGNOSIS — M779 Enthesopathy, unspecified: Secondary | ICD-10-CM | POA: Diagnosis not present

## 2018-05-14 MED ORDER — TRIAMCINOLONE ACETONIDE 10 MG/ML IJ SUSP
10.0000 mg | Freq: Once | INTRAMUSCULAR | Status: AC
  Administered 2018-05-14: 10 mg

## 2018-05-14 MED ORDER — PREDNISONE 10 MG PO TABS: ORAL_TABLET | ORAL | 0 refills | Status: DC

## 2018-05-14 NOTE — Telephone Encounter (Signed)
If she has a boot or surgical shoe from before still she should go into that. I have not seen her since 2017 so therefore I cannot prescribe pain medication. If she wants to be seen before Friday she can always see another physician in the office today as I am not there today.

## 2018-05-14 NOTE — Progress Notes (Signed)
Subjective:   Patient ID: Rebecca Mccullough, female   DOB: 47 y.o.   MRN: [id number]   HPI Patient presents stating she is had a lot of pain in her right big toe joint and now has developed pain in the rest of the foot and also states she gets pain that sometimes goes into her ankle.  States it has been bothering her since her surgery of 2 years ago but has really gotten worse over the last 3 months and is increasingly making it hard for her to walk   ROS      Objective:  Physical Exam  Neurovascular status was found to be intact with patient having incision of the right first metatarsal and a small incision on the right second metatarsal where she had had previous surgery with hallux varus deformity that was corrected by Dr. Shella Spearing with fusion of the first metatarsal phalangeal joint.  He had placed her on a bone stimulator and she did not come in for appointments after that was placed on her several years ago.  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     Plan:  H&P and x-rays reviewed with patient.  At this point will get a focus on the lesser MPJs and see what kind of relief we can get her and I did inject with 60 mg like Marcaine mixture and I then went ahead and aspirated each joint getting out a small amount of clear fluid and injected quarter cc of dexamethasone Kenalog into each joint.  I then advised her on reduced activity and I placed her on a Deltasone 12-day Dosepak to try to reduce her inflammatory component and we will reevaluate her at that time and also will get Dr. 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

## 2018-05-14 NOTE — Telephone Encounter (Signed)
I contacted pt and informed of Dr. Gabriel Rung recommendations and offered an appt today with another doctor. Pt states she went back into her surgical shoe and would accept an appt today. Transferred pt to scheduler.

## 2018-05-15 ENCOUNTER — Encounter: Payer: Self-pay | Admitting: *Deleted

## 2018-05-15 ENCOUNTER — Telehealth: Payer: Self-pay | Admitting: *Deleted

## 2018-05-15 DIAGNOSIS — M2011 Hallux valgus (acquired), right foot: Secondary | ICD-10-CM | POA: Diagnosis not present

## 2018-05-15 NOTE — Telephone Encounter (Signed)
Pt states she received a series of shots from Dr. Charlsie Merles yesterday and her foot is killing her. Pt begins to cry. I told pt that I felt she was having a steroid flare, which is an exacerbation of her symptoms, to ice 3-4 times daily 15-20 minutes/sessions protecting the skin from the ice pack with a light towel, and if could tolerate take regular strength tylenol as the OTC package instructs. Pt requested a note to leave work if she had to and a replacement cam walker for the one her dog ate. I told pt I could write her a note to be out of work today if necessary and we would have to bill her insurance for the new cam walker. Pt states understanding.

## 2018-05-16 ENCOUNTER — Ambulatory Visit: Payer: Managed Care, Other (non HMO) | Admitting: Podiatry

## 2018-05-19 ENCOUNTER — Emergency Department (HOSPITAL_COMMUNITY): Payer: Managed Care, Other (non HMO)

## 2018-05-19 ENCOUNTER — Other Ambulatory Visit: Payer: Self-pay

## 2018-05-19 ENCOUNTER — Emergency Department (HOSPITAL_COMMUNITY)
Admission: EM | Admit: 2018-05-19 | Discharge: 2018-05-19 | Disposition: A | Discharge status: Home / Self Care | Payer: Managed Care, Other (non HMO) | Attending: Emergency Medicine | Admitting: Emergency Medicine

## 2018-05-19 DIAGNOSIS — S5001XA Contusion of right elbow, initial encounter: Secondary | ICD-10-CM | POA: Diagnosis not present

## 2018-05-19 DIAGNOSIS — Y92481 Parking lot as the place of occurrence of the external cause: Secondary | ICD-10-CM | POA: Diagnosis not present

## 2018-05-19 DIAGNOSIS — I10 Essential (primary) hypertension: Secondary | ICD-10-CM | POA: Diagnosis not present

## 2018-05-19 DIAGNOSIS — Y999 Unspecified external cause status: Secondary | ICD-10-CM | POA: Diagnosis not present

## 2018-05-19 DIAGNOSIS — Z79899 Other long term (current) drug therapy: Secondary | ICD-10-CM | POA: Insufficient documentation

## 2018-05-19 DIAGNOSIS — Z23 Encounter for immunization: Secondary | ICD-10-CM | POA: Insufficient documentation

## 2018-05-19 DIAGNOSIS — T07XXXA Unspecified multiple injuries, initial encounter: Secondary | ICD-10-CM

## 2018-05-19 DIAGNOSIS — Y939 Activity, unspecified: Secondary | ICD-10-CM | POA: Insufficient documentation

## 2018-05-19 DIAGNOSIS — S80812A Abrasion, left lower leg, initial encounter: Secondary | ICD-10-CM | POA: Diagnosis not present

## 2018-05-19 DIAGNOSIS — S59901A Unspecified injury of right elbow, initial encounter: Secondary | ICD-10-CM | POA: Diagnosis present

## 2018-05-19 MED ORDER — BACITRACIN ZINC 500 UNIT/GM EX OINT: 1.0000 "application " | TOPICAL_OINTMENT | Freq: Two times a day (BID) | CUTANEOUS | 0 refills | Status: DC

## 2018-05-19 MED ORDER — IBUPROFEN 800 MG PO TABS: 800.0000 mg | ORAL_TABLET | Freq: Three times a day (TID) | ORAL | 0 refills | Status: DC

## 2018-05-19 MED ORDER — BACITRACIN ZINC 500 UNIT/GM EX OINT
1.0000 "application " | TOPICAL_OINTMENT | Freq: Two times a day (BID) | CUTANEOUS | Status: DC
  Administered 2018-05-19: 1 via TOPICAL
  Filled 2018-05-19: qty 1.8

## 2018-05-19 MED ORDER — TETANUS-DIPHTH-ACELL PERTUSSIS 5-2.5-18.5 LF-MCG/0.5 IM SUSP
0.5000 mL | Freq: Once | INTRAMUSCULAR | Status: AC
  Administered 2018-05-19: 0.5 mL via INTRAMUSCULAR
  Filled 2018-05-19: qty 0.5

## 2018-05-19 MED ORDER — IBUPROFEN 800 MG PO TABS
800.0000 mg | ORAL_TABLET | Freq: Once | ORAL | Status: AC
  Administered 2018-05-19: 800 mg via ORAL
  Filled 2018-05-19: qty 1

## 2018-05-19 NOTE — Discharge Instructions (Signed)
Change your dressing twice daily, keep an antibiotic ointment on this twice a day, clean with warm soapy water twice a day  Seek medical attention for severe or worsening pain swelling redness or fevers or pus from the wound
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
## 2018-05-19 NOTE — ED Notes (Signed)
Help get patient undress on the monitor patient is resting with call bell in reach 
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
## 2018-05-27 ENCOUNTER — Ambulatory Visit: Payer: Managed Care, Other (non HMO) | Admitting: Podiatry

## 2018-05-29 ENCOUNTER — Ambulatory Visit: Payer: Managed Care, Other (non HMO) | Admitting: Podiatry

## 2018-07-03 ENCOUNTER — Ambulatory Visit: Payer: Managed Care, Other (non HMO) | Admitting: Podiatry

## 2018-10-15 ENCOUNTER — Other Ambulatory Visit: Payer: Self-pay | Admitting: *Deleted

## 2018-10-15 ENCOUNTER — Encounter: Payer: Self-pay | Admitting: *Deleted

## 2018-10-17 ENCOUNTER — Encounter: Payer: Self-pay | Admitting: *Deleted

## 2018-10-17 ENCOUNTER — Encounter: Payer: Self-pay | Admitting: Diagnostic Neuroimaging

## 2018-10-17 ENCOUNTER — Ambulatory Visit: Payer: Managed Care, Other (non HMO) | Admitting: Diagnostic Neuroimaging

## 2018-10-17 VITALS — BP 140/94 | HR 101 | Ht 65.5 in | Wt 175.4 lb

## 2018-10-17 DIAGNOSIS — R200 Anesthesia of skin: Secondary | ICD-10-CM | POA: Diagnosis not present

## 2018-10-17 DIAGNOSIS — F329 Major depressive disorder, single episode, unspecified: Secondary | ICD-10-CM

## 2018-10-17 DIAGNOSIS — G894 Chronic pain syndrome: Secondary | ICD-10-CM | POA: Diagnosis not present

## 2018-10-17 DIAGNOSIS — R413 Other amnesia: Secondary | ICD-10-CM

## 2018-10-17 DIAGNOSIS — F32A Depression, unspecified: Secondary | ICD-10-CM

## 2018-10-17 NOTE — Progress Notes (Signed)
[facility]  PATIENT: Rebecca Mccullough DOB: [birth year]  REFERRING CLINICIAN: Skeet Simmer HISTORY FROM: patient  REASON FOR VISIT: new consult    HISTORICAL  CHIEF COMPLAINT:  Chief Complaint  Patient presents with  . Multiple joint pain    rm 7, New Pt, beginning age 47- numbness/tingling/pins and needles of hands/feet, daily pain all over like the flu-worse at night, vision loss, memory loss, slurred speech"    HISTORY OF PRESENT ILLNESS:   47 year old female here for evaluation of constellation of symptoms including memory loss, numbness, pain, fatigue.  Patient remembers normal childhood, with no major health issues until age 86 years old.  At that time she started to develop some low back pain, was diagnosed with scoliosis.  Patient continued to stay active through high school and college.    No major issues until around age 48 years old, following the birth of her second child, but when she starts to develop significant depression anxiety symptoms.  She was evaluated and treated at that time.  Around 2011 patient was diagnosed with attention deficit disorder and tried on Vyvanse.  No benefit.  Around 2012- 2014 patient was having intermittent episodes of unilateral numbness, slurred speech, faculty talking.  She went to the emergency room several times for TIA evaluations.  Work-up was negative.  Patient has been to psychiatry and psychology clinic in the past, last seen around 2016.  Patient was on Abilify in the past and seemed to do better with her depression symptoms.  Over the last 5 years patient continues to get worse with general symptoms including memory loss, slurred speech, repeating herself, fatigue, pain, burning sensation, numbness and tingling.  Due to persistent symptoms patient referred here to help rule out multiple sclerosis.   REVIEW OF SYSTEMS: Full 14 system review of systems performed and negative with exception of: Memory loss  confusion headache numbness slurred speech restless legs insomnia chills blurred vision snoring cough shortness of breath chest pain blood in stool urination problems joint pain depression anxiety disinterest activities.  ALLERGIES: Allergies  Allergen Reactions  . Oxycodone Hypertension and Other (See Comments)    States it spiked her BP and caused her to have TIAs States it spiked her BP and caused her to have TIAs  . Methadone Other (See Comments) and Swelling    Swelling.  . Other Swelling    A pain patch that starts with a B" A pain patch that starts with a B"  . Fentanyl Nausea And Vomiting    HOME MEDICATIONS:  Outpatient Encounter Medications as of 10/17/2018  Medication Sig  . ALPRAZolam (XANAX) 1 MG tablet Take 1 mg by mouth 2 (two) times daily as needed for anxiety or sleep.   Marland Kitchen desvenlafaxine (PRISTIQ) 100 MG 24 hr tablet Take 100 mg by mouth daily.  Marland Kitchen lisinopril-hydrochlorothiazide (PRINZIDE,ZESTORETIC) 20-25 MG tablet Take by mouth.  . metoprolol succinate (TOPROL-XL) 100 MG 24 hr tablet Take 100 mg by mouth daily. Take with or immediately following a meal.  . valACYclovir (VALTREX) 1000 MG tablet   . [DISCONTINUED] zolpidem (AMBIEN) 5 MG tablet Take 5 mg by mouth at bedtime as needed for sleep.   . [DISCONTINUED] amitriptyline (ELAVIL) 25 MG tablet Take by mouth.  . [DISCONTINUED] ARIPiprazole (ABILIFY) 5 MG tablet Take 5 mg by mouth.  . [DISCONTINUED] bacitracin ointment Apply 1 application topically 2 (two) times daily. (Patient not taking: Reported on 10/17/2018)  . [DISCONTINUED] butalbital-acetaminophen-caffeine (FIORICET) 50-325-40 MG per tablet Take 1 tablet by  mouth every 6 (six) hours as needed for headache. (Patient not taking: Reported on 10/17/2018)  . [DISCONTINUED] celecoxib (CELEBREX) 200 MG capsule TAKE 1 CAPSULE BY MOUTH TWICE A DAY  . [DISCONTINUED] cephALEXin (KEFLEX) 500 MG capsule Take 500 mg by mouth 4 (four) times daily.  . [DISCONTINUED]  Cholecalciferol (VITAMIN D3) 50000 units CAPS TAKE 1 CAPSULE BY MOUTH EVERY WEEK FOR 30 DAYS  . [DISCONTINUED] Cholecalciferol (VITAMIN D3) 50000 units CAPS   . [DISCONTINUED] cyanocobalamin (,VITAMIN B-12,) 1000 MCG/ML injection   . [DISCONTINUED] desvenlafaxine (PRISTIQ) 100 MG 24 hr tablet Take 100 mg by mouth.  . [DISCONTINUED] diazepam (VALIUM) 5 MG tablet Take 5 mg by mouth.  . [DISCONTINUED] hydrochlorothiazide (HYDRODIURIL) 25 MG tablet Take 25 mg by mouth daily.   . [DISCONTINUED] hydrochlorothiazide (HYDRODIURIL) 25 MG tablet Take 25 mg by mouth.  . [DISCONTINUED] HYDROmorphone (DILAUDID) 2 MG tablet 1-2 tablets every 4-6 hours as needed for foot pain. (Patient not taking: Reported on 10/17/2018)  . [DISCONTINUED] hydrOXYzine (ATARAX/VISTARIL) 25 MG tablet Take 25 mg by mouth.  . [DISCONTINUED] ibuprofen (ADVIL,MOTRIN) 800 MG tablet Take 1 tablet (800 mg total) by mouth 3 (three) times daily. (Patient not taking: Reported on 10/17/2018)  . [DISCONTINUED] meperidine (DEMEROL) 50 MG tablet TAKE 1 TABLET EVERY 4 TO 6 HOURS AS NEEDED FOR PAIN  . [DISCONTINUED] metoprolol succinate (TOPROL-XL) 100 MG 24 hr tablet Take 100 mg by mouth.  . [DISCONTINUED] pantoprazole (PROTONIX) 40 MG tablet Take by mouth.  . [DISCONTINUED] pentosan polysulfate (ELMIRON) 100 MG capsule Take 200 mg by mouth.  . [DISCONTINUED] Phentermine-Topiramate (QSYMIA) 7.5-46 MG CP24 Take by mouth.  . [DISCONTINUED] predniSONE (DELTASONE) 10 MG tablet 12 day tapering dose (Patient not taking: Reported on 10/17/2018)  . [DISCONTINUED] promethazine (PHENERGAN) 12.5 MG tablet TAKE ONE TABLET BY MOUTH EVERY 6 HOURS AS NEEDED FOR NAUSEA/VOMITING  . [DISCONTINUED] solifenacin (VESICARE) 10 MG tablet Take 10 mg by mouth.  . [DISCONTINUED] valACYclovir (VALTREX) 1000 MG tablet   . [DISCONTINUED] Vortioxetine HBr (BRINTELLIX) 10 MG TABS Take 10 mg by mouth.  . [DISCONTINUED] VYVANSE 50 MG capsule Take 50 mg by mouth every morning.     No facility-administered encounter medications on file as of 10/17/2018.     PAST MEDICAL HISTORY: Past Medical History:  Diagnosis Date  . Anxiety   . Cervical radiculopathy   . Chronic fatigue   . Depression   . Fibromyalgia    dx at age 66  . Hypertension   . Rapid heart rate   . Restless leg syndrome   . TIA (transient ischemic attack)    pt states 2 since 4/12    PAST SURGICAL HISTORY: Past Surgical History:  Procedure Laterality Date  . ABDOMINAL HYSTERECTOMY  2001  . TONSILLECTOMY     age 57    FAMILY HISTORY: Family History  Problem Relation Age of Onset  . Arthritis Paternal Grandmother   . Cancer Paternal Grandmother        breast. bladder, ovarian, uterine  . AAA (abdominal aortic aneurysm) Mother        accident  . Stroke Paternal Aunt   . Seizures Paternal Aunt   . Arthritis Paternal Aunt        rheumatoid    SOCIAL HISTORY: Social History   Socioeconomic History  . Marital status: Married    Spouse name: Nedra Hai  . Number of children: 2  . Years of education: BS  . Highest education level: Not on file  Occupational History  Comment: Residence [employer]  . Financial resource strain: Not on file  . Food insecurity:    Worry: Not on file    Inability: Not on file  . Transportation needs:    Medical: Not on file    Non-medical: Not on file  Tobacco Use  . Smoking status: Never Smoker  . Smokeless tobacco: Never Used  Substance and Sexual Activity  . Alcohol use: Yes    Comment: occasional  . Drug use: No  . Sexual activity: Yes    Birth control/protection: Surgical  Lifestyle  . Physical activity:    Days per week: Not on file    Minutes per session: Not on file  . Stress: Not on file  Relationships  . Social connections:    Talks on phone: Not on file    Gets together: Not on file    Attends religious service: Not on file    Active member of club or organization: Not on file    Attends meetings of clubs or  organizations: Not on file    Relationship status: Not on file  . Intimate partner violence:    Fear of current or ex partner: Not on file    Emotionally abused: Not on file    Physically abused: Not on file    Forced sexual activity: Not on file  Other Topics Concern  . Not on file  Social History Narrative   Lives with husband   Caffeine- coffee, 1 cup daily     PHYSICAL EXAM  GENERAL EXAM/CONSTITUTIONAL: Vitals:  Vitals:   10/17/18 0808  BP: (!) 140/94  Pulse: (!) 101  Weight: 175 lb 6.4 oz (79.6 kg)  Height: 5' 5.5" (1.664 m)     Body mass index is 28.74 kg/m. Wt Readings from Last 3 Encounters:  10/17/18 175 lb 6.4 oz (79.6 kg)  05/19/18 160 lb (72.6 kg)  09/20/13 149 lb (67.6 kg)     Patient is in no distress; well developed, nourished and groomed; neck is supple  CARDIOVASCULAR:  Examination of carotid arteries is normal; no carotid bruits  Regular rate and rhythm, no murmurs  Examination of peripheral vascular system by observation and palpation is normal  EYES:  Ophthalmoscopic exam of optic discs and posterior segments is normal; no papilledema or hemorrhages  Visual Acuity Screening   Right eye Left eye Both eyes  Without correction: 20/40 20/40   With correction:        MUSCULOSKELETAL:  Gait, strength, tone, movements noted in Neurologic exam below  NEUROLOGIC: MENTAL STATUS:  No flowsheet data found.  awake, alert, oriented to person, place and time  recent and remote memory intact  normal attention and concentration  language fluent, comprehension intact, naming intact  fund of knowledge appropriate  CRANIAL NERVE:   2nd - no papilledema on fundoscopic exam  2nd, 3rd, 4th, 6th - pupils equal and reactive to light, visual fields full to confrontation, extraocular muscles intact, no nystagmus  5th - facial sensation symmetric  7th - facial strength symmetric  8th - hearing intact  9th - palate elevates symmetrically,  uvula midline  11th - shoulder shrug symmetric  12th - tongue protrusion midline  MOTOR:   normal bulk and tone, full strength in the BUE, BLE  SENSORY:   normal and symmetric to light touch, temperature, vibration  COORDINATION:   finger-nose-finger, fine finger movements normal  REFLEXES:   deep tendon reflexes present and symmetric  GAIT/STATION:   narrow based  gait; able to walk on tandem     DIAGNOSTIC DATA (LABS, IMAGING, TESTING) - I reviewed patient records, labs, notes, testing and imaging myself where available.  Lab Results  Component Value Date   WBC 9.4 10/01/2016   HGB 13.1 10/01/2016   HCT 37.7 10/01/2016   MCV 91.3 10/01/2016   PLT 359 10/01/2016      Component Value Date/Time   NA 139 10/01/2016 1834   K 3.6 10/01/2016 1834   CL 110 10/01/2016 1834   CO2 24 10/01/2016 1834   GLUCOSE 87 10/01/2016 1834   BUN 7 10/01/2016 1834   CREATININE 0.56 10/01/2016 1834   CALCIUM 8.6 (L) 10/01/2016 1834   PROT 7.8 09/20/2013 1725   ALBUMIN 4.2 09/20/2013 1725   AST 12 09/20/2013 1725   ALT 9 09/20/2013 1725   ALKPHOS 88 09/20/2013 1725   BILITOT 0.5 09/20/2013 1725   GFRNONAA >60 10/01/2016 1834   GFRAA >60 10/01/2016 1834   Lab Results  Component Value Date   CHOL 164 09/18/2011   HDL 62 09/18/2011   LDLCALC 93 09/18/2011   TRIG 45 09/18/2011   CHOLHDL 2.6 09/18/2011   Lab Results  Component Value Date   HGBA1C 5.0 09/18/2011   No results found for: [id number] Lab Results  Component Value Date   TSH 0.958 04/20/2011    09/20/13 MRI brain [I reviewed images myself and agree with interpretation. -VRP]  - Stable and normal non contrast MRI appearance of the brain.     ASSESSMENT AND PLAN  47 y.o. year old female here with constellation of symptoms including memory loss, numbness, pain, slurred speech, with underlying significant depression and anxiety and chronic pain.  Will check MRI of the brain to rule out other neurologic  etiologies.   Dx:  1. Memory loss   2. Depression, unspecified depression type   3. Chronic pain syndrome   4. Numbness       PLAN:  - check MRI brain w/wo (rule out multiple sclerosis) - consider psychiatry / psychology evaluation for depression / anxiety - optimize nutrition and exercise  Orders Placed This Encounter  Procedures  . MR BRAIN W WO CONTRAST   Return pending test results.    Suanne Marker, MD 10/17/2018, 8:46 AM Certified in Neurology, Neurophysiology and Neuroimaging  [facility] [address], [address] [place], [place] [address] [phone]

## 2018-10-17 NOTE — Patient Instructions (Signed)
Thank you for coming to see [place] at [facility]. I hope we have been able to provide you high quality care today.  You may receive a patient satisfaction survey over the next few weeks. We would appreciate your feedback and comments so that we may continue to improve ourselves and the health of our patients.  - check MRI brain w/wo - consider psychiatry / psychology evaluation for depression / anxiety - optimize nutrition and exercise   ~~~~~~~~~~~~~~~~~~~~~~~~~~~~~~~~~~~~~~~~~~~~~~~~~~~~~~~~~~~~~~~~~  DR. PENUMALLI'S GUIDE TO HAPPY AND HEALTHY LIVING These are some of my general health and wellness recommendations. Some of them may apply to you better than others. Please use common sense as you try these suggestions and feel free to ask me any questions.   ACTIVITY/FITNESS Mental, social, emotional and physical stimulation are very important for brain and body health. Try learning a new activity (arts, music, language, sports, games).  Keep moving your body to the best of your abilities. You can do this at home, inside or outside, the park, community center, gym or anywhere you like. Consider a physical therapist or personal trainer to get started. Fitness trackers, smart-watches or  smart-phones can help as well.   NUTRITION Eat more plants: colorful vegetables, nuts, seeds and berries.  Eat less sugar, salt, preservatives and processed foods.  Avoid toxins such as cigarettes and alcohol.  Drink water when you are thirsty. Warm water with a slice of lemon is an excellent morning drink to start the day.  Consider these websites for more information The Nutrition Source (https://www.henry-hernandez.biz/) [employer] Nutrition (WindowBlog.ch)   RELAXATION Consider practicing mindfulness meditation or other relaxation techniques such as deep breathing, prayer, yoga, tai chi, massage. See website mindful.org or the apps  Headspace or Calm to help get started.   SLEEP Try to get at least 7-8+ hours sleep per day. Regular exercise and reduced caffeine will help you sleep better. Practice good sleep hygeine techniques. See website sleep.org for more information.   PLANNING Prepare estate planning, living will, healthcare POA documents. Sometimes this is best planned with the help of an attorney. Theconversationproject.org and agingwithdignity.org are excellent resources.

## 2018-10-20 ENCOUNTER — Telehealth: Payer: Self-pay | Admitting: Diagnostic Neuroimaging

## 2018-10-20 NOTE — Telephone Encounter (Signed)
[employer] order sent to [facility]. They obtain the auth and will reach out to the pt to schedule.  °

## 2018-10-20 NOTE — Telephone Encounter (Signed)
lvm for pt to be aware of this. Left [facility] number of [phone] and to call them if she has not head in the next 2-3 business days.

## 2018-10-29 ENCOUNTER — Other Ambulatory Visit: Payer: Managed Care, Other (non HMO)

## 2018-10-29 ENCOUNTER — Ambulatory Visit
Admission: RE | Admit: 2018-10-29 | Discharge: 2018-10-29 | Disposition: A | Discharge status: Home / Self Care | Payer: Managed Care, Other (non HMO) | Source: Ambulatory Visit | Attending: Diagnostic Neuroimaging | Admitting: Diagnostic Neuroimaging

## 2018-10-29 DIAGNOSIS — R413 Other amnesia: Secondary | ICD-10-CM

## 2018-10-29 MED ORDER — GADOBENATE DIMEGLUMINE 529 MG/ML IV SOLN
16.0000 mL | Freq: Once | INTRAVENOUS | Status: AC | PRN
  Administered 2018-10-29: 16 mL via INTRAVENOUS

## 2018-10-29 NOTE — Telephone Encounter (Signed)
[name]: [id number] (exp. 10/28/18 to 2/320) patient is scheduled at [facility] for 10/29/18.

## 2018-10-30 ENCOUNTER — Telehealth: Payer: Self-pay | Admitting: Diagnostic Neuroimaging

## 2018-10-30 NOTE — Telephone Encounter (Signed)
Pt has called , asking to be called by RN once MRI results are available.

## 2018-10-31 ENCOUNTER — Telehealth: Payer: Self-pay | Admitting: *Deleted

## 2018-10-31 NOTE — Telephone Encounter (Signed)
-----   Message from Suanne Marker, MD sent at 10/30/2018  5:17 PM EST ----- Unremarkable imaging results. Please call patient. Continue current plan. -VRP

## 2018-10-31 NOTE — Telephone Encounter (Signed)
See other phone note

## 2018-10-31 NOTE — Telephone Encounter (Signed)
We can check MRI cervical spine and EMG/NCS for numbness eval. But these would not explain the memory issues. -VRP

## 2018-10-31 NOTE — Telephone Encounter (Signed)
Spoke to pt and relayed that her MRI normal.  She still is wondering what may be causing the numbness in hands and feet and feet.  I asked about psych/ psychology eval and she brushed that off.     Could it be carpal tunnel?  Do [place]/EMG. I told her would be glad to check and see what  Other recommendations.  Would contact next week on this and she was ok.

## 2018-11-03 NOTE — Telephone Encounter (Signed)
LMVM for pt that was returning call from last Friday, f/u on numbness. Per Dr. Marjory Lies could do MRI cervical, [place]/EMG for numbness evaluation, this would not explain memory issues.   If she wanted to proceed to let us know.

## 2019-12-03 ENCOUNTER — Ambulatory Visit: Payer: Managed Care, Other (non HMO) | Admitting: Podiatry

## 2019-12-03 ENCOUNTER — Other Ambulatory Visit: Payer: Self-pay

## 2019-12-03 ENCOUNTER — Encounter: Payer: Self-pay | Admitting: Podiatry

## 2019-12-03 ENCOUNTER — Other Ambulatory Visit: Payer: Self-pay | Admitting: Podiatry

## 2019-12-03 ENCOUNTER — Ambulatory Visit (INDEPENDENT_AMBULATORY_CARE_PROVIDER_SITE_OTHER): Payer: Self-pay

## 2019-12-03 DIAGNOSIS — M1990 Unspecified osteoarthritis, unspecified site: Secondary | ICD-10-CM | POA: Diagnosis not present

## 2019-12-03 DIAGNOSIS — M778 Other enthesopathies, not elsewhere classified: Secondary | ICD-10-CM

## 2019-12-03 DIAGNOSIS — M779 Enthesopathy, unspecified: Secondary | ICD-10-CM

## 2019-12-03 MED ORDER — METHYLPREDNISOLONE 4 MG PO TBPK: ORAL_TABLET | ORAL | 0 refills | Status: DC

## 2019-12-03 MED ORDER — MELOXICAM 15 MG PO TABS: 15.0000 mg | ORAL_TABLET | Freq: Every day | ORAL | 0 refills | Status: DC

## 2019-12-03 NOTE — Progress Notes (Signed)
.  h 

## 2019-12-03 NOTE — Patient Instructions (Signed)
Start with the medrol dose pack (steroid) and once complete you can start the mobic   Plantar Fasciitis (Heel Spur Syndrome) with Rehab The plantar fascia is a fibrous, ligament-like, soft-tissue structure that spans the bottom of the foot. Plantar fasciitis is a condition that causes pain in the foot due to inflammation of the tissue. SYMPTOMS   Pain and tenderness on the underneath side of the foot.  Pain that worsens with standing or walking. CAUSES  Plantar fasciitis is caused by irritation and injury to the plantar fascia on the underneath side of the foot. Common mechanisms of injury include:  Direct trauma to bottom of the foot.  Damage to a small nerve that runs under the foot where the main fascia attaches to the heel bone.  Stress placed on the plantar fascia due to bone spurs. RISK INCREASES WITH:   Activities that place stress on the plantar fascia (running, jumping, pivoting, or cutting).  Poor strength and flexibility.  Improperly fitted shoes.  Tight calf muscles.  Flat feet.  Failure to warm-up properly before activity.  Obesity. PREVENTION  Warm up and stretch properly before activity.  Allow for adequate recovery between workouts.  Maintain physical fitness:  Strength, flexibility, and endurance.  Cardiovascular fitness.  Maintain a health body weight.  Avoid stress on the plantar fascia.  Wear properly fitted shoes, including arch supports for individuals who have flat feet.  PROGNOSIS  If treated properly, then the symptoms of plantar fasciitis usually resolve without surgery. However, occasionally surgery is necessary.  RELATED COMPLICATIONS   Recurrent symptoms that may result in a chronic condition.  Problems of the lower back that are caused by compensating for the injury, such as limping.  Pain or weakness of the foot during push-off following surgery.  Chronic inflammation, scarring, and partial or complete fascia tear,  occurring more often from repeated injections.  TREATMENT  Treatment initially involves the use of ice and medication to help reduce pain and inflammation. The use of strengthening and stretching exercises may help reduce pain with activity, especially stretches of the Achilles tendon. These exercises may be performed at home or with a therapist. Your caregiver may recommend that you use heel cups of arch supports to help reduce stress on the plantar fascia. Occasionally, corticosteroid injections are given to reduce inflammation. If symptoms persist for greater than 6 months despite non-surgical (conservative), then surgery may be recommended.   MEDICATION   If pain medication is necessary, then nonsteroidal anti-inflammatory medications, such as aspirin and ibuprofen, or other minor pain relievers, such as acetaminophen, are often recommended.  Do not take pain medication within 7 days before surgery.  Prescription pain relievers may be given if deemed necessary by your caregiver. Use only as directed and only as much as you need.  Corticosteroid injections may be given by your caregiver. These injections should be reserved for the most serious cases, because they may only be given a certain number of times.  HEAT AND COLD  Cold treatment (icing) relieves pain and reduces inflammation. Cold treatment should be applied for 10 to 15 minutes every 2 to 3 hours for inflammation and pain and immediately after any activity that aggravates your symptoms. Use ice packs or massage the area with a piece of ice (ice massage).  Heat treatment may be used prior to performing the stretching and strengthening activities prescribed by your caregiver, physical therapist, or athletic trainer. Use a heat pack or soak the injury in warm water.  SEEK IMMEDIATE  MEDICAL CARE IF:  Treatment seems to offer no benefit, or the condition worsens.  Any medications produce adverse side effects.  EXERCISES- RANGE OF  MOTION (ROM) AND STRETCHING EXERCISES - Plantar Fasciitis (Heel Spur Syndrome) These exercises may help you when beginning to rehabilitate your injury. Your symptoms may resolve with or without further involvement from your physician, physical therapist or athletic trainer. While completing these exercises, remember:   Restoring tissue flexibility helps normal motion to return to the joints. This allows healthier, less painful movement and activity.  An effective stretch should be held for at least 30 seconds.  A stretch should never be painful. You should only feel a gentle lengthening or release in the stretched tissue.  RANGE OF MOTION - Toe Extension, Flexion  Sit with your right / left leg crossed over your opposite knee.  Grasp your toes and gently pull them back toward the top of your foot. You should feel a stretch on the bottom of your toes and/or foot.  Hold this stretch for 10 seconds.  Now, gently pull your toes toward the bottom of your foot. You should feel a stretch on the top of your toes and or foot.  Hold this stretch for 10 seconds. Repeat  times. Complete this stretch 3 times per day.   RANGE OF MOTION - Ankle Dorsiflexion, Active Assisted  Remove shoes and sit on a chair that is preferably not on a carpeted surface.  Place right / left foot under knee. Extend your opposite leg for support.  Keeping your heel down, slide your right / left foot back toward the chair until you feel a stretch at your ankle or calf. If you do not feel a stretch, slide your bottom forward to the edge of the chair, while still keeping your heel down.  Hold this stretch for 10 seconds. Repeat 3 times. Complete this stretch 2 times per day.   STRETCH  Gastroc, Standing  Place hands on wall.  Extend right / left leg, keeping the front knee somewhat bent.  Slightly point your toes inward on your back foot.  Keeping your right / left heel on the floor and your knee straight, shift  your weight toward the wall, not allowing your back to arch.  You should feel a gentle stretch in the right / left calf. Hold this position for 10 seconds. Repeat 3 times. Complete this stretch 2 times per day.  STRETCH  Soleus, Standing  Place hands on wall.  Extend right / left leg, keeping the other knee somewhat bent.  Slightly point your toes inward on your back foot.  Keep your right / left heel on the floor, bend your back knee, and slightly shift your weight over the back leg so that you feel a gentle stretch deep in your back calf.  Hold this position for 10 seconds. Repeat 3 times. Complete this stretch 2 times per day.  STRETCH  Gastrocsoleus, Standing  Note: This exercise can place a lot of stress on your foot and ankle. Please complete this exercise only if specifically instructed by your caregiver.   Place the ball of your right / left foot on a step, keeping your other foot firmly on the same step.  Hold on to the wall or a rail for balance.  Slowly lift your other foot, allowing your body weight to press your heel down over the edge of the step.  You should feel a stretch in your right / left calf.  Hold  this position for 10 seconds.  Repeat this exercise with a slight bend in your right / left knee. Repeat 3 times. Complete this stretch 2 times per day.   STRENGTHENING EXERCISES - Plantar Fasciitis (Heel Spur Syndrome)  These exercises may help you when beginning to rehabilitate your injury. They may resolve your symptoms with or without further involvement from your physician, physical therapist or athletic trainer. While completing these exercises, remember:   Muscles can gain both the endurance and the strength needed for everyday activities through controlled exercises.  Complete these exercises as instructed by your physician, physical therapist or athletic trainer. Progress the resistance and repetitions only as guided.  STRENGTH - Towel Curls  Sit in  a chair positioned on a non-carpeted surface.  Place your foot on a towel, keeping your heel on the floor.  Pull the towel toward your heel by only curling your toes. Keep your heel on the floor. Repeat 3 times. Complete this exercise 2 times per day.  STRENGTH - Ankle Inversion  Secure one end of a rubber exercise band/tubing to a fixed object (table, pole). Loop the other end around your foot just before your toes.  Place your fists between your knees. This will focus your strengthening at your ankle.  Slowly, pull your big toe up and in, making sure the band/tubing is positioned to resist the entire motion.  Hold this position for 10 seconds.  Have your muscles resist the band/tubing as it slowly pulls your foot back to the starting position. Repeat 3 times. Complete this exercises 2 times per day.  Document Released: 12/10/2005 Document Revised: 03/03/2012 Document Reviewed: 03/24/2009 [facility] Patient Information 2014 [place], [place].

## 2019-12-09 ENCOUNTER — Encounter: Payer: Self-pay | Admitting: Podiatry

## 2019-12-09 LAB — ANA: Anti Nuclear Antibody (ANA): POSITIVE — AB

## 2019-12-09 LAB — C-REACTIVE PROTEIN: CRP: 2 mg/L (ref <8.0)

## 2019-12-09 LAB — SEDIMENTATION RATE: Sed Rate: 14 mm/h (ref 0–20)

## 2019-12-09 LAB — RHEUMATOID FACTOR: Rheumatoid fact SerPl-aCnc: <14 IU/mL (ref <14)

## 2019-12-09 LAB — ANTI-NUCLEAR AB-TITER (ANA TITER): ANA Titer 1: 1:80 {titer} — ABNORMAL HIGH

## 2019-12-10 NOTE — Progress Notes (Signed)
Subjective: 48 year old female presents the office today for concerns of chronic bilateral foot pain.  She states that she was getting pain into the ball of her foot last year and she was seen by Dr. Paulla Dolly for this and she had injections.  It did help some.  She said her feet hurt all over but there were spots and she states more on the dorsal, lateral aspects of the foot as well as the bottom of the heel.  She gets some swelling to the right big toe joint over the previous surgery.  She states that her feet feel a constant ache and she feels like her feet have the "flu".  Denies any recent injury.  She is on her feet all day. Denies any systemic complaints such as fevers, chills, nausea, vomiting. No acute changes since last appointment, and no other complaints at this time.   Objective: AAO x3, NAD DP/PT pulses palpable bilaterally, CRT less than 3 seconds Scars from prior surgeries are well-healed.  There is tenderness to palpation on the plantar aspect of the calcaneus insertion of the plantar fascia.  Tenderness on the course the peroneal tendons bilaterally.  Plantar fascial, peroneal, Achilles tendon appear to be intact.  Mild swelling to the feet there is no erythema or warmth.  No significant discomfort on the right previous first MPJ arthrodesis site.  She has a history of previous bunion surgery in the left side.  No area pinpoint tenderness.  No pain with calf compression, swelling, warmth, erythema  Assessment: Bilateral chronic foot pain  Plan: -All treatment options discussed with the patient including all alternatives, risks, complications.  -X-rays obtained and reviewed.  Hardware intact from the prior first MPJ arthrodesis.  Previous bunion surgery in the left side.  No evidence of acute fracture. -From a surgical standpoint the right side that does not seem to be causing her issues.  Arthrodesis site appears to be stable.  Gets occasional swelling but no significant  pain. -Prescribed a Medrol Dosepak.  Once this is complete we will start meloxicam.  Discussed side effects medication.  Discussed shoe modifications and orthotics.  I had Liliane Channel see her for orthotics today.  Also ordered blood work to evaluate for possible underlying systemic arthritis given chronic foot pain. -Patient encouraged to call the office with any questions, concerns, change in symptoms.   Trula Slade DPM

## 2019-12-10 NOTE — Progress Notes (Signed)
Val- Can you please put in for a rheumatology consult due to positive ANA? I have sent the patient a mychart message letting her know we are going to do this. Thanks.

## 2019-12-11 ENCOUNTER — Telehealth: Payer: Self-pay | Admitting: *Deleted

## 2019-12-11 DIAGNOSIS — M1990 Unspecified osteoarthritis, unspecified site: Secondary | ICD-10-CM

## 2019-12-11 DIAGNOSIS — M2011 Hallux valgus (acquired), right foot: Secondary | ICD-10-CM

## 2019-12-11 DIAGNOSIS — M779 Enthesopathy, unspecified: Secondary | ICD-10-CM

## 2019-12-11 NOTE — Telephone Encounter (Signed)
-----   Message from Trula Slade, DPM sent at 12/10/2019  7:15 AM EST ----- Val- Can you please put in for a rheumatology consult due to positive ANA? I have sent the patient a mychart message letting her know we are going to do this. Thanks.

## 2019-12-11 NOTE — Telephone Encounter (Signed)
Faxed required forms, clinicals and demographics to [facility].

## 2019-12-31 ENCOUNTER — Other Ambulatory Visit: Payer: Self-pay | Admitting: Orthotics

## 2019-12-31 ENCOUNTER — Ambulatory Visit: Payer: Self-pay | Admitting: Podiatry

## 2019-12-31 ENCOUNTER — Other Ambulatory Visit: Payer: Self-pay | Admitting: Podiatry

## 2019-12-31 DIAGNOSIS — M779 Enthesopathy, unspecified: Secondary | ICD-10-CM

## 2020-02-02 ENCOUNTER — Other Ambulatory Visit: Payer: Self-pay | Admitting: Orthotics

## 2020-02-02 ENCOUNTER — Ambulatory Visit: Payer: Self-pay | Admitting: Podiatry

## 2020-02-04 ENCOUNTER — Ambulatory Visit: Payer: 59 | Admitting: Podiatry

## 2021-09-21 ENCOUNTER — Other Ambulatory Visit: Payer: Self-pay | Admitting: *Deleted

## 2021-09-21 ENCOUNTER — Encounter: Payer: Self-pay | Admitting: *Deleted

## 2021-09-25 ENCOUNTER — Institutional Professional Consult (permissible substitution): Payer: Self-pay | Admitting: Diagnostic Neuroimaging

## 2021-09-25 ENCOUNTER — Encounter: Payer: Self-pay | Admitting: Diagnostic Neuroimaging

## 2022-04-10 ENCOUNTER — Ambulatory Visit (INDEPENDENT_AMBULATORY_CARE_PROVIDER_SITE_OTHER): Payer: No Typology Code available for payment source | Admitting: Podiatry

## 2022-04-10 ENCOUNTER — Ambulatory Visit (INDEPENDENT_AMBULATORY_CARE_PROVIDER_SITE_OTHER): Payer: No Typology Code available for payment source

## 2022-04-10 DIAGNOSIS — M779 Enthesopathy, unspecified: Secondary | ICD-10-CM

## 2022-04-10 DIAGNOSIS — M2550 Pain in unspecified joint: Secondary | ICD-10-CM | POA: Diagnosis not present

## 2022-04-12 ENCOUNTER — Encounter: Payer: Self-pay | Admitting: Podiatry

## 2022-04-13 NOTE — Progress Notes (Signed)
Subjective:  ? ?Patient ID: Rebecca Mccullough, female   DOB: 51 y.o.   MRN: [id number]  ? ?HPI ?51 year old female presents the office today for concerns of bilateral chronic foot pain.  She has pain in the top as well as the side of both of her feet pointing the lateral aspect.  She states been ongoing for quite some time but has been getting worse over the last 6 months.  She does report some swelling to the foot.  She gets tenderness near the fifth toe. ? ?She previously had a positive ANA and was referred to rheumatology.  She states that she also has multiple other joint and feels that there is something autoimmune going on as well. ? ? ?Review of Systems  ?All other systems reviewed and are negative. ? ?Past Medical History:  ?Diagnosis Date  ? Anxiety   ? Cervical radiculopathy   ? Chronic fatigue   ? Depression   ? Fibromyalgia   ? dx at age 85  ? Hypertension   ? Low back pain   ? Rapid heart rate   ? Restless leg syndrome   ? TIA (transient ischemic attack)   ? pt states 2 since 4/12  ? ? ?Past Surgical History:  ?Procedure Laterality Date  ? ABDOMINAL HYSTERECTOMY  2001  ? TONSILLECTOMY    ? age 73  ? ? ? ?Current Outpatient Medications:  ?  albuterol (VENTOLIN HFA) 108 (90 Base) MCG/ACT inhaler, Inhale 2 puffs into the lungs every 6 (six) hours as needed., Disp: , Rfl:  ?  ALPRAZolam (XANAX) 1 MG tablet, Take 1 mg by mouth 2 (two) times daily as needed for anxiety or sleep. , Disp: , Rfl:  ?  ARIPiprazole (ABILIFY) 15 MG tablet, Take 15 mg by mouth daily., Disp: , Rfl:  ?  desvenlafaxine (PRISTIQ) 100 MG 24 hr tablet, Take 100 mg by mouth daily., Disp: , Rfl:  ?  eszopiclone (LUNESTA) 2 MG TABS tablet, Take 2 mg by mouth at bedtime., Disp: , Rfl:  ?  hydrochlorothiazide (HYDRODIURIL) 25 MG tablet, Take by mouth., Disp: , Rfl:  ?  metoprolol succinate (TOPROL-XL) 100 MG 24 hr tablet, Take 100 mg by mouth daily. Take with or immediately following a meal., Disp: , Rfl:  ?  zolpidem (AMBIEN) 10 MG tablet, Take  by mouth as needed., Disp: , Rfl:  ? ?Allergies  ?Allergen Reactions  ? Oxycodone Hypertension and Other (See Comments)  ?  States it spiked her BP and caused her to have TIAs ?States it spiked her BP and caused her to have TIAs  ? Methadone Other (See Comments) and Swelling  ?  Swelling.  ? Other Swelling  ?  A pain patch that starts with a B" ?A pain patch that starts with a B"  ? Fentanyl Nausea And Vomiting  ? ? ? ? ? ?   ?Objective:  ?Physical Exam  ?General: AAO x3, NAD ? ?Dermatological: Incisions from prior surgeries are well-healed. ? ?Vascular: Dorsalis Pedis artery and Posterior Tibial artery pedal pulses are 2/4 bilateral with immedate capillary fill time. There is no pain with calf compression, swelling, warmth, erythema.  ? ?Neruologic: Grossly intact via light touch bilateral.  Negative sign. ? ?Musculoskeletal: Tenderness mostly on the plantar, lateral aspects of the bilateral feet which is symmetrical.  There is no area pinpoint tenderness.  Previous bunion surgery left foot with mild restriction of first MPJ range of motion.  Right first MPJ arthrodesis with elevation of the  first ray upon weightbearing.  Chronic appearing edema present the first MPJ there is no erythema or warmth.  Flexor, extensor tendons appear to be intact.  Muscular strength 5/5 in all groups tested bilateral. ? ?Gait: Unassisted, Nonantalgic.  ? ? ?   ?Assessment:  ? ?51 year old female with bilateral chronic foot pain ? ?   ?Plan:  ?-Treatment options discussed including all alternatives, risks, and complications ?-Etiology of symptoms were discussed ?-X-rays were obtained and reviewed with the patient.  3 views of bilateral feet were obtained.  Status post bunionectomy left foot.  Right foot status post first MPJ arthrodesis with 1 broken screw.  Radiolucency across the arthrodesis site. ?-Clinically the arthrodesis site appears to be stable and not having significant pain to this area.  Overall her pain is symmetrical  bilaterally and chronic foot pain.  We discussed different medications for this but she is not able to tolerate oral steroids and is avoiding anti-inflammatories.  I do think she may have some underlying autoimmune issue given her multiple joint pains.  She is requesting a second opinion for rheumatology and I will refer her to rheumatology for this.  In the meantime was discussed wearing shoes and good arch supports.  Consider steroid injection if needed. ? ?Vivi Barrack DPM ? ?   ? ?

## 2022-04-17 ENCOUNTER — Telehealth: Payer: Self-pay | Admitting: *Deleted

## 2022-04-17 ENCOUNTER — Telehealth: Payer: Self-pay | Admitting: Podiatry

## 2022-04-17 NOTE — Telephone Encounter (Signed)
Referral has been sent to [facility] -04/17/22

## 2022-04-17 NOTE — Telephone Encounter (Signed)
Omega with [facility] called and states that the patient has been discharged from their practice and would not be able to see the patient. ?

## 2022-04-17 NOTE — Telephone Encounter (Signed)
Faxed referral /office notes to [facility], received confirmation 04/17/22. Patient has been notified. ?

## 2022-04-17 NOTE — Telephone Encounter (Signed)
Pt returning call to [facility]. States that it is okay to schedule with anyone for rheumatology that Dr. Ardelle Anton recommends.  ? ?Please advise ?

## 2022-04-18 NOTE — Telephone Encounter (Signed)
Called [facility] and they are not taking on any new patients so I can resend to [facility] Rheumat[facility], booking for Aug.Please advise

## 2022-04-20 NOTE — Telephone Encounter (Signed)
Referral has been sent to [facility] 04/19/22, confirmation received

## 2022-05-02 ENCOUNTER — Telehealth: Payer: Self-pay | Admitting: Podiatry

## 2022-05-02 NOTE — Telephone Encounter (Signed)
Dr. Benjamine Mola Declined Rheumatology for pt.  ?No inflammation problem clear from records. Previous work up pf ANA + was unremarkable.  ? ? ?

## 2022-05-04 NOTE — Telephone Encounter (Signed)
I have attempted to call the patient, no answer. Left VM to call back.  ?

## 2023-07-01 ENCOUNTER — Telehealth: Payer: Self-pay | Admitting: Diagnostic Neuroimaging

## 2023-07-01 ENCOUNTER — Ambulatory Visit: Payer: BC Managed Care – PPO | Admitting: Diagnostic Neuroimaging

## 2023-07-01 ENCOUNTER — Encounter: Payer: Self-pay | Admitting: Diagnostic Neuroimaging

## 2023-07-01 VITALS — BP 116/76 | HR 93 | Ht 65.0 in | Wt 188.0 lb

## 2023-07-01 DIAGNOSIS — G894 Chronic pain syndrome: Secondary | ICD-10-CM

## 2023-07-01 NOTE — Patient Instructions (Signed)
CHRONIC PAIN SYNDROME (muscle ache, pain, fatigue, numbness, cold feeling; since ~age 52 years old) - optimize nutrition, sleep, exercise, stress mgmt - refer to integrative therapies for eval and tx - consider pain mgmt clinic evaluation - follow up with PCP, psychiatry for anxiety, insomnia eval - follow up endocrinology for grave's disease treatment

## 2023-07-01 NOTE — Progress Notes (Signed)
[facility]  PATIENT: Rebecca Mccullough DOB: 1971/01/15  REFERRING CLINICIAN: April Manson, NP HISTORY FROM: patient  REASON FOR VISIT: new consult   HISTORICAL  CHIEF COMPLAINT:  Chief Complaint  Patient presents with   New Patient (Initial Visit)    Rm 7, here alone Pt is here for BUE and BLE neuropathy.  States when she wakes up in the morning her hands are numb. States has started to feel pain on on elbows and knees for the past 6 months.     HISTORY OF PRESENT ILLNESS:   UPDATE (07/01/23, VRP): Since last visit, continues with muscle aches, fatigue, pain, cold sensations all over. Has been to lupus clinic in [place] and ruled out lupus. Continuation and progression of symptoms since ~ age 26 years old. Now dx'd with graves dz, on methimazole. Going to gym 5 days a week.   PRIOR HPI (10/17/18, VRP): 52 year old female here for evaluation of constellation of symptoms including memory loss, numbness, pain, fatigue.   Patient remembers normal childhood, with no major health issues until age 41 years old.  At that time she started to develop some low back pain, was diagnosed with scoliosis.  Patient continued to stay active through high school and college.     No major issues until around age 15 years old, following the birth of her second child, but when she starts to develop significant depression anxiety symptoms.  She was evaluated and treated at that time.   Around 2011 patient was diagnosed with attention deficit disorder and tried on Vyvanse.  No benefit.   Around 2012- 2014 patient was having intermittent episodes of unilateral numbness, slurred speech, faculty talking.  She went to the emergency room several times for TIA evaluations.  Work-up was negative.   Patient has been to psychiatry and psychology clinic in the past, last seen around 2016.  Patient was on Abilify in the past and seemed to do better with her depression symptoms.   Over the last 5  years patient continues to get worse with general symptoms including memory loss, slurred speech, repeating herself, fatigue, pain, burning sensation, numbness and tingling.   Due to persistent symptoms patient referred here to help rule out multiple sclerosis.   REVIEW OF SYSTEMS: Full 14 system review of systems performed and negative with exception of: as per hPI.  ALLERGIES: Allergies  Allergen Reactions   Oxycodone Hypertension and Other (See Comments)    States it spiked her BP and caused her to have TIAs States it spiked her BP and caused her to have TIAs   Methadone Other (See Comments) and Swelling    Swelling.   Other Swelling    A pain patch that starts with a B" A pain patch that starts with a B"   Fentanyl Nausea And Vomiting    HOME MEDICATIONS: Outpatient Medications Prior to Visit  Medication Sig Dispense Refill   ALPRAZolam (XANAX) 1 MG tablet Take 1 mg by mouth 2 (two) times daily as needed for anxiety or sleep.      ARIPiprazole (ABILIFY) 15 MG tablet Take 15 mg by mouth daily.     carvedilol (COREG) 12.5 MG tablet Take 12.5 mg by mouth 2 (two) times daily with a meal.     cycloSPORINE (RESTASIS) 0.05 % ophthalmic emulsion Place 1 drop into both eyes 2 (two) times daily.     desvenlafaxine (PRISTIQ) 100 MG 24 hr tablet Take 100 mg by mouth daily.  eszopiclone (LUNESTA) 2 MG TABS tablet Take 2 mg by mouth at bedtime.     hydrochlorothiazide (HYDRODIURIL) 12.5 MG tablet Take 12.5 mg by mouth daily.     hydrochlorothiazide (HYDRODIURIL) 25 MG tablet Take by mouth.     lisinopril (ZESTRIL) 20 MG tablet Take 20 mg by mouth daily.     methimazole (TAPAZOLE) 5 MG tablet Take 5 mg by mouth 3 (three) times daily.     metoprolol succinate (TOPROL-XL) 100 MG 24 hr tablet Take 100 mg by mouth daily. Take with or immediately following a meal.     zolpidem (AMBIEN) 10 MG tablet Take by mouth as needed.     albuterol (VENTOLIN HFA) 108 (90 Base) MCG/ACT inhaler Inhale 2  puffs into the lungs every 6 (six) hours as needed.     lisinopril (ZESTRIL) 30 MG tablet Take 30 mg by mouth daily.     No facility-administered medications prior to visit.    PAST MEDICAL HISTORY: Past Medical History:  Diagnosis Date   Anxiety    Cervical radiculopathy    Chronic fatigue    Depression    Fibromyalgia    dx at age 47   Hypertension    Low back pain    Rapid heart rate    Restless leg syndrome    TIA (transient ischemic attack)    pt states 2 since 4/12    PAST SURGICAL HISTORY: Past Surgical History:  Procedure Laterality Date   ABDOMINAL HYSTERECTOMY  2001   TONSILLECTOMY     age 38    FAMILY HISTORY: Family History  Problem Relation Age of Onset   Arthritis Paternal Grandmother    Cancer Paternal Grandmother        breast. bladder, ovarian, uterine   AAA (abdominal aortic aneurysm) Mother        accident   Stroke Paternal Aunt    Seizures Paternal Aunt    Arthritis Paternal Aunt        rheumatoid    SOCIAL HISTORY: Social History   Socioeconomic History   Marital status: Married    Spouse name: Nedra Hai   Number of children: 2   Years of education: BS   Highest education level: Not on file  Occupational History    Comment: [place]  Tobacco Use   Smoking status: Never   Smokeless tobacco: Never  Substance and Sexual Activity   Alcohol use: Yes    Comment: occasional   Drug use: No   Sexual activity: Yes    Birth control/protection: Surgical  Other Topics Concern   Not on file  Social History Narrative   Lives with husband   Caffeine- coffee, 1 cup daily   R handed   Social Determinants of Health   Financial Resource Strain: Not on file  Food Insecurity: Not on file  Transportation Needs: Not on file  Physical Activity: Not on file  Stress: Not on file  Social Connections: Not on file  Intimate Partner Violence: Not on file     PHYSICAL EXAM  GENERAL EXAM/CONSTITUTIONAL: Vitals:  Vitals:   07/01/23 0951  BP:  116/76  Pulse: 93  Weight: 188 lb (85.3 kg)  Height: 5\' 5"  (1.651 m)   Body mass index is 31.28 kg/m. Wt Readings from Last 3 Encounters:  07/01/23 188 lb (85.3 kg)  10/17/18 175 lb 6.4 oz (79.6 kg)  05/19/18 160 lb (72.6 kg)   Patient is in no distress; well developed, nourished and groomed; neck is supple  CARDIOVASCULAR: Examination of carotid arteries is normal; no carotid bruits Regular rate and rhythm, no murmurs Examination of peripheral vascular system by observation and palpation is normal  EYES: Ophthalmoscopic exam of optic discs and posterior segments is normal; no papilledema or hemorrhages No results found.  MUSCULOSKELETAL: Gait, strength, tone, movements noted in Neurologic exam below  NEUROLOGIC: MENTAL STATUS:      No data to display         awake, alert, oriented to person, place and time recent and remote memory intact normal attention and concentration language fluent, comprehension intact, naming intact fund of knowledge appropriate  CRANIAL NERVE:  2nd - no papilledema on fundoscopic exam 2nd, 3rd, 4th, 6th - pupils equal and reactive to light, visual fields full to confrontation, extraocular muscles intact, no nystagmus 5th - facial sensation symmetric 7th - facial strength symmetric 8th - hearing intact 9th - palate elevates symmetrically, uvula midline 11th - shoulder shrug symmetric 12th - tongue protrusion midline  MOTOR:  normal bulk and tone, full strength in the BUE, BLE  SENSORY:  normal and symmetric to light touch, temperature, vibration  COORDINATION:  finger-nose-finger, fine finger movements normal  REFLEXES:  deep tendon reflexes 1+ and symmetric  GAIT/STATION:  narrow based gait     DIAGNOSTIC DATA (LABS, IMAGING, TESTING) - I reviewed patient records, labs, notes, testing and imaging myself where available.  Lab Results  Component Value Date   WBC 9.4 10/01/2016   HGB 13.1 10/01/2016   HCT 37.7  10/01/2016   MCV 91.3 10/01/2016   PLT 359 10/01/2016      Component Value Date/Time   NA 139 10/01/2016 1834   K 3.6 10/01/2016 1834   CL 110 10/01/2016 1834   CO2 24 10/01/2016 1834   GLUCOSE 87 10/01/2016 1834   BUN 7 10/01/2016 1834   CREATININE 0.56 10/01/2016 1834   CALCIUM 8.6 (L) 10/01/2016 1834   PROT 7.8 09/20/2013 1725   ALBUMIN 4.2 09/20/2013 1725   AST 12 09/20/2013 1725   ALT 9 09/20/2013 1725   ALKPHOS 88 09/20/2013 1725   BILITOT 0.5 09/20/2013 1725   GFRNONAA >60 10/01/2016 1834   GFRAA >60 10/01/2016 1834   Lab Results  Component Value Date   CHOL 164 09/18/2011   HDL 62 09/18/2011   LDLCALC 93 09/18/2011   TRIG 45 09/18/2011   CHOLHDL 2.6 09/18/2011   Lab Results  Component Value Date   HGBA1C 5.0 09/18/2011   No results found for: "VITAMINB12" Lab Results  Component Value Date   TSH 0.958 04/20/2011   Component Ref Range & Units 1 mo ago  Vitamin B-12 232 - 1245 pg/mL 366   10/29/18 MRI brain - Normal brain MRI.     ASSESSMENT AND PLAN  52 y.o. year old female here with:   Dx:  1. Chronic pain syndrome     PLAN:  CHRONIC PAIN SYNDROME (muscle ache, pain, fatigue, numbness, cold feeling; since ~age 81 years old; MRI brain normal in 2019) - optimize nutrition, sleep, exercise, stress mgmt - refer to integrative therapies (PT) for eval and tx - consider pain mgmt clinic evaluation - follow up with PCP, psychiatry for anxiety, insomnia eval - follow up endocrinology for grave's disease treatment  Orders Placed This Encounter  Procedures   Ambulatory referral to Physical Therapy   Return for return to PCP, pending if symptoms worsen or fail to improve.    Suanne Marker, MD 07/01/2023, 10:51 AM Certified in Neurology, Neurophysiology and  Neuroimaging  [facility] [address] [place], [place] [address] [phone]

## 2023-07-01 NOTE — Telephone Encounter (Signed)
Referral sent to [facility]: Phone: [phone]   Fax: [phone]

## 2023-07-03 DIAGNOSIS — E0590 Thyrotoxicosis, unspecified without thyrotoxic crisis or storm: Secondary | ICD-10-CM | POA: Diagnosis not present

## 2023-07-09 DIAGNOSIS — E0590 Thyrotoxicosis, unspecified without thyrotoxic crisis or storm: Secondary | ICD-10-CM | POA: Diagnosis not present

## 2023-07-18 DIAGNOSIS — U071 COVID-19: Secondary | ICD-10-CM | POA: Diagnosis not present

## 2023-07-25 DIAGNOSIS — U071 COVID-19: Secondary | ICD-10-CM | POA: Diagnosis not present

## 2023-07-27 DIAGNOSIS — E0500 Thyrotoxicosis with diffuse goiter without thyrotoxic crisis or storm: Secondary | ICD-10-CM | POA: Diagnosis not present

## 2023-07-27 DIAGNOSIS — R000 Tachycardia, unspecified: Secondary | ICD-10-CM | POA: Diagnosis not present

## 2023-07-27 DIAGNOSIS — F419 Anxiety disorder, unspecified: Secondary | ICD-10-CM | POA: Diagnosis not present

## 2023-07-27 DIAGNOSIS — I10 Essential (primary) hypertension: Secondary | ICD-10-CM | POA: Diagnosis not present

## 2023-07-27 DIAGNOSIS — R002 Palpitations: Secondary | ICD-10-CM | POA: Diagnosis not present

## 2023-07-27 DIAGNOSIS — R0602 Shortness of breath: Secondary | ICD-10-CM | POA: Diagnosis not present

## 2023-07-27 DIAGNOSIS — R232 Flushing: Secondary | ICD-10-CM | POA: Diagnosis not present

## 2023-07-29 DIAGNOSIS — E0590 Thyrotoxicosis, unspecified without thyrotoxic crisis or storm: Secondary | ICD-10-CM | POA: Diagnosis not present

## 2023-07-29 DIAGNOSIS — E0500 Thyrotoxicosis with diffuse goiter without thyrotoxic crisis or storm: Secondary | ICD-10-CM | POA: Diagnosis not present

## 2023-08-02 DIAGNOSIS — U071 COVID-19: Secondary | ICD-10-CM | POA: Diagnosis not present

## 2023-08-02 DIAGNOSIS — J0100 Acute maxillary sinusitis, unspecified: Secondary | ICD-10-CM | POA: Diagnosis not present

## 2023-08-02 DIAGNOSIS — E0590 Thyrotoxicosis, unspecified without thyrotoxic crisis or storm: Secondary | ICD-10-CM | POA: Diagnosis not present

## 2023-08-06 DIAGNOSIS — E0500 Thyrotoxicosis with diffuse goiter without thyrotoxic crisis or storm: Secondary | ICD-10-CM | POA: Diagnosis not present

## 2023-08-06 DIAGNOSIS — Z8616 Personal history of COVID-19: Secondary | ICD-10-CM | POA: Diagnosis not present

## 2023-08-20 ENCOUNTER — Ambulatory Visit: Payer: Managed Care, Other (non HMO) | Admitting: Diagnostic Neuroimaging

## 2023-09-02 DIAGNOSIS — E0590 Thyrotoxicosis, unspecified without thyrotoxic crisis or storm: Secondary | ICD-10-CM | POA: Diagnosis not present

## 2023-09-18 DIAGNOSIS — E0590 Thyrotoxicosis, unspecified without thyrotoxic crisis or storm: Secondary | ICD-10-CM | POA: Diagnosis not present

## 2023-10-01 DIAGNOSIS — M47814 Spondylosis without myelopathy or radiculopathy, thoracic region: Secondary | ICD-10-CM | POA: Diagnosis not present

## 2023-10-17 DIAGNOSIS — E0590 Thyrotoxicosis, unspecified without thyrotoxic crisis or storm: Secondary | ICD-10-CM | POA: Diagnosis not present

## 2023-11-20 DIAGNOSIS — E0789 Other specified disorders of thyroid: Secondary | ICD-10-CM | POA: Diagnosis not present

## 2023-11-20 DIAGNOSIS — H10523 Angular blepharoconjunctivitis, bilateral: Secondary | ICD-10-CM | POA: Diagnosis not present

## 2023-11-20 DIAGNOSIS — H04123 Dry eye syndrome of bilateral lacrimal glands: Secondary | ICD-10-CM | POA: Diagnosis not present

## 2023-11-27 DIAGNOSIS — K5900 Constipation, unspecified: Secondary | ICD-10-CM | POA: Diagnosis not present

## 2023-11-27 DIAGNOSIS — R110 Nausea: Secondary | ICD-10-CM | POA: Diagnosis not present

## 2023-11-27 DIAGNOSIS — R1084 Generalized abdominal pain: Secondary | ICD-10-CM | POA: Diagnosis not present

## 2023-11-27 DIAGNOSIS — K219 Gastro-esophageal reflux disease without esophagitis: Secondary | ICD-10-CM | POA: Diagnosis not present

## 2023-11-28 DIAGNOSIS — R109 Unspecified abdominal pain: Secondary | ICD-10-CM | POA: Diagnosis not present

## 2023-11-28 DIAGNOSIS — K219 Gastro-esophageal reflux disease without esophagitis: Secondary | ICD-10-CM | POA: Diagnosis not present

## 2023-11-28 DIAGNOSIS — K5900 Constipation, unspecified: Secondary | ICD-10-CM | POA: Diagnosis not present

## 2023-11-28 DIAGNOSIS — R110 Nausea: Secondary | ICD-10-CM | POA: Diagnosis not present

## 2023-12-13 DIAGNOSIS — Z1331 Encounter for screening for depression: Secondary | ICD-10-CM | POA: Diagnosis not present

## 2023-12-13 DIAGNOSIS — I10 Essential (primary) hypertension: Secondary | ICD-10-CM | POA: Diagnosis not present

## 2023-12-13 DIAGNOSIS — E0590 Thyrotoxicosis, unspecified without thyrotoxic crisis or storm: Secondary | ICD-10-CM | POA: Diagnosis not present

## 2023-12-13 DIAGNOSIS — R946 Abnormal results of thyroid function studies: Secondary | ICD-10-CM | POA: Diagnosis not present

## 2023-12-13 DIAGNOSIS — G43809 Other migraine, not intractable, without status migrainosus: Secondary | ICD-10-CM | POA: Diagnosis not present

## 2024-01-10 DIAGNOSIS — R350 Frequency of micturition: Secondary | ICD-10-CM | POA: Diagnosis not present

## 2024-01-10 DIAGNOSIS — K5730 Diverticulosis of large intestine without perforation or abscess without bleeding: Secondary | ICD-10-CM | POA: Diagnosis not present

## 2024-01-10 DIAGNOSIS — R1084 Generalized abdominal pain: Secondary | ICD-10-CM | POA: Diagnosis not present

## 2024-01-10 DIAGNOSIS — R1012 Left upper quadrant pain: Secondary | ICD-10-CM | POA: Diagnosis not present

## 2024-01-10 DIAGNOSIS — R112 Nausea with vomiting, unspecified: Secondary | ICD-10-CM | POA: Diagnosis not present

## 2024-01-10 DIAGNOSIS — R0781 Pleurodynia: Secondary | ICD-10-CM | POA: Diagnosis not present

## 2024-01-10 DIAGNOSIS — R110 Nausea: Secondary | ICD-10-CM | POA: Diagnosis not present

## 2024-01-17 ENCOUNTER — Ambulatory Visit: Payer: BC Managed Care – PPO | Admitting: Podiatry

## 2024-01-21 ENCOUNTER — Ambulatory Visit: Payer: BC Managed Care – PPO | Admitting: Podiatry

## 2024-03-06 DIAGNOSIS — H6693 Otitis media, unspecified, bilateral: Secondary | ICD-10-CM | POA: Diagnosis not present

## 2024-03-06 DIAGNOSIS — H9202 Otalgia, left ear: Secondary | ICD-10-CM | POA: Diagnosis not present

## 2024-04-09 DIAGNOSIS — M47896 Other spondylosis, lumbar region: Secondary | ICD-10-CM | POA: Diagnosis not present

## 2024-05-21 DIAGNOSIS — R92333 Mammographic heterogeneous density, bilateral breasts: Secondary | ICD-10-CM | POA: Diagnosis not present

## 2024-05-21 DIAGNOSIS — Z1231 Encounter for screening mammogram for malignant neoplasm of breast: Secondary | ICD-10-CM | POA: Diagnosis not present

## 2024-05-27 DIAGNOSIS — M47812 Spondylosis without myelopathy or radiculopathy, cervical region: Secondary | ICD-10-CM | POA: Diagnosis not present

## 2024-06-18 DIAGNOSIS — E0590 Thyrotoxicosis, unspecified without thyrotoxic crisis or storm: Secondary | ICD-10-CM | POA: Diagnosis not present

## 2024-06-23 ENCOUNTER — Other Ambulatory Visit (HOSPITAL_COMMUNITY): Payer: Self-pay | Admitting: Nurse Practitioner

## 2024-06-23 DIAGNOSIS — E0590 Thyrotoxicosis, unspecified without thyrotoxic crisis or storm: Secondary | ICD-10-CM

## 2024-06-23 DIAGNOSIS — R000 Tachycardia, unspecified: Secondary | ICD-10-CM | POA: Diagnosis not present

## 2024-07-08 ENCOUNTER — Encounter (HOSPITAL_COMMUNITY)
Admission: RE | Admit: 2024-07-08 | Discharge: 2024-07-08 | Disposition: A | Discharge status: Home / Self Care | Payer: Self-pay | Source: Ambulatory Visit | Attending: Nurse Practitioner | Admitting: Nurse Practitioner

## 2024-07-08 DIAGNOSIS — E0590 Thyrotoxicosis, unspecified without thyrotoxic crisis or storm: Secondary | ICD-10-CM | POA: Diagnosis not present

## 2024-07-08 MED ORDER — SODIUM IODIDE I-123 7.4 MBQ CAPS
440.2000 | ORAL_CAPSULE | Freq: Once | ORAL | Status: AC
  Administered 2024-07-08: 440.2 via ORAL

## 2024-07-09 ENCOUNTER — Encounter (HOSPITAL_COMMUNITY): Payer: Self-pay

## 2024-07-09 ENCOUNTER — Encounter (HOSPITAL_COMMUNITY)
Admission: RE | Admit: 2024-07-09 | Discharge: 2024-07-09 | Disposition: A | Discharge status: Home / Self Care | Payer: Self-pay | Source: Ambulatory Visit | Attending: Nurse Practitioner | Admitting: Nurse Practitioner

## 2024-07-09 DIAGNOSIS — E0500 Thyrotoxicosis with diffuse goiter without thyrotoxic crisis or storm: Secondary | ICD-10-CM | POA: Diagnosis not present

## 2024-07-09 DIAGNOSIS — R251 Tremor, unspecified: Secondary | ICD-10-CM | POA: Diagnosis not present

## 2024-07-15 ENCOUNTER — Other Ambulatory Visit (HOSPITAL_COMMUNITY): Payer: Self-pay | Admitting: Nurse Practitioner

## 2024-07-15 DIAGNOSIS — E0590 Thyrotoxicosis, unspecified without thyrotoxic crisis or storm: Secondary | ICD-10-CM

## 2024-07-21 NOTE — Written Directive (Cosign Needed)
  I-131 WHOLE THYROID  THERAPY (NON-CANCER)    RADIOPHARMACEUTICAL:   Iodine-131 Capsule    PRESCRIBED DOSE FOR ADMINISTRATION: 20 mCi   ROUTE OFADMINISTRATION: PO   DIAGNOSIS:  hyperthyroidism    REFERRING PHYSICIAN: Harlene Bill, FNP   TSH:    Lab Results  Component Value Date   TSH 0.958 04/20/2011     PRIOR I-131 THERAPY (Date and Dose):   PRIOR RADIOLOGY EXAMS (Results and Date): NM THYROID  MULT UPTAKE W/IMAGING Result Date: 07/13/2024 CLINICAL DATA:  Hyperthyroidism. Symptoms include weight loss, heat intolerance, hand tremors, difficulty sleeping. Hair loss. TSH equal 1.8. Trial of methimazole. EXAM: THYROID  SCAN AND UPTAKE - 4 AND 24 HOURS TECHNIQUE: Following oral administration of I-123 capsule, anterior planar imaging was acquired at 24 hours. Thyroid  uptake was calculated with a thyroid  probe at 4-6 hours and 24 hours. RADIOPHARMACEUTICALS:  Four hundred twenty uCi I-123 sodium iodide p.o. COMPARISON:  None Available. FINDINGS: Uniform uptake in the thyroid  gland.  No nodularity. 4 hour I-123 uptake = 13.7% (normal 5-20%) 24 hour I-123 uptake = 40% (normal 10-30%) IMPRESSION: Clinical history, imaging and elevated iodine uptake are suggestive Graves disease. Electronically Signed   By: Jackquline Boxer M.D.   On: 07/13/2024 16:54      ADDITIONAL PHYSICIAN COMMENTS/NOTES  Hyperthyroidism. Symptoms include weight loss, heat intolerance, hand tremors, difficulty sleeping. Hair loss. TSH equal 1.8. Trial of methimazole.   Graves disease  AUTHORIZED USER SIGNATURE & TIME STAMP:  Norleen GORMAN Boxer, MD   07/22/24    10:18 AM

## 2024-07-24 ENCOUNTER — Encounter (HOSPITAL_COMMUNITY)
Admission: RE | Admit: 2024-07-24 | Discharge: 2024-07-24 | Disposition: A | Discharge status: Home / Self Care | Source: Ambulatory Visit | Attending: Nurse Practitioner | Admitting: Nurse Practitioner

## 2024-07-24 DIAGNOSIS — E0590 Thyrotoxicosis, unspecified without thyrotoxic crisis or storm: Secondary | ICD-10-CM | POA: Insufficient documentation

## 2024-07-24 DIAGNOSIS — R251 Tremor, unspecified: Secondary | ICD-10-CM | POA: Diagnosis not present

## 2024-07-24 DIAGNOSIS — E0500 Thyrotoxicosis with diffuse goiter without thyrotoxic crisis or storm: Secondary | ICD-10-CM | POA: Diagnosis not present

## 2024-07-24 MED ORDER — SODIUM IODIDE I 131 CAPSULE
18.6000 | Freq: Once | INTRAVENOUS | Status: AC | PRN
  Administered 2024-07-24: 18.6 via ORAL

## 2024-07-30 DIAGNOSIS — E0590 Thyrotoxicosis, unspecified without thyrotoxic crisis or storm: Secondary | ICD-10-CM | POA: Diagnosis not present

## 2024-08-17 ENCOUNTER — Emergency Department (HOSPITAL_BASED_OUTPATIENT_CLINIC_OR_DEPARTMENT_OTHER)
Admission: EM | Admit: 2024-08-17 | Discharge: 2024-08-17 | Discharge status: Against Medical Advice | Attending: Emergency Medicine | Admitting: Emergency Medicine

## 2024-08-17 ENCOUNTER — Emergency Department (HOSPITAL_BASED_OUTPATIENT_CLINIC_OR_DEPARTMENT_OTHER)

## 2024-08-17 ENCOUNTER — Other Ambulatory Visit: Payer: Self-pay

## 2024-08-17 DIAGNOSIS — Z5329 Procedure and treatment not carried out because of patient's decision for other reasons: Secondary | ICD-10-CM | POA: Diagnosis not present

## 2024-08-17 DIAGNOSIS — R070 Pain in throat: Secondary | ICD-10-CM | POA: Diagnosis not present

## 2024-08-17 DIAGNOSIS — R079 Chest pain, unspecified: Secondary | ICD-10-CM | POA: Diagnosis not present

## 2024-08-17 DIAGNOSIS — R0602 Shortness of breath: Secondary | ICD-10-CM | POA: Diagnosis not present

## 2024-08-17 DIAGNOSIS — R0789 Other chest pain: Secondary | ICD-10-CM | POA: Diagnosis not present

## 2024-08-17 DIAGNOSIS — R000 Tachycardia, unspecified: Secondary | ICD-10-CM | POA: Diagnosis not present

## 2024-08-17 DIAGNOSIS — M25519 Pain in unspecified shoulder: Secondary | ICD-10-CM | POA: Diagnosis not present

## 2024-08-17 DIAGNOSIS — E0590 Thyrotoxicosis, unspecified without thyrotoxic crisis or storm: Secondary | ICD-10-CM | POA: Diagnosis not present

## 2024-08-17 DIAGNOSIS — E0500 Thyrotoxicosis with diffuse goiter without thyrotoxic crisis or storm: Secondary | ICD-10-CM | POA: Diagnosis not present

## 2024-08-17 LAB — BASIC METABOLIC PANEL WITH GFR
Anion gap: 12 (ref 5–15)
BUN: 7 mg/dL (ref 6–20)
CO2: 22 mmol/L (ref 22–32)
Calcium: 9.5 mg/dL (ref 8.9–10.3)
Chloride: 106 mmol/L (ref 98–111)
Creatinine, Ser: 0.56 mg/dL (ref 0.44–1.00)
GFR, Estimated: >60 mL/min (ref >60)
Glucose, Bld: 96 mg/dL (ref 70–99)
Potassium: 4.2 mmol/L (ref 3.5–5.1)
Sodium: 139 mmol/L (ref 135–145)

## 2024-08-17 LAB — CBC
HCT: 38.4 % (ref 36.0–46.0)
Hemoglobin: 13.2 g/dL (ref 12.0–15.0)
MCH: 30.7 pg (ref 26.0–34.0)
MCHC: 34.4 g/dL (ref 30.0–36.0)
MCV: 89.3 fL (ref 80.0–100.0)
Platelets: 441 K/uL — ABNORMAL HIGH (ref 150–400)
RBC: 4.3 MIL/uL (ref 3.87–5.11)
RDW: 12.3 % (ref 11.5–15.5)
WBC: 9.3 K/uL (ref 4.0–10.5)
nRBC: 0 % (ref 0.0–0.2)

## 2024-08-17 LAB — TSH: TSH: <0.01 u[IU]/mL — ABNORMAL LOW (ref 0.350–4.500)

## 2024-08-17 LAB — TROPONIN T, HIGH SENSITIVITY
Troponin T High Sensitivity: <15 ng/L (ref 0–19)
Troponin T High Sensitivity: <15 ng/L (ref 0–19)

## 2024-08-17 MED ORDER — CARVEDILOL 12.5 MG PO TABS
25.0000 mg | ORAL_TABLET | Freq: Once | ORAL | Status: AC
  Administered 2024-08-17: 25 mg via ORAL
  Filled 2024-08-17: qty 2

## 2024-08-17 MED ORDER — IOHEXOL 350 MG/ML SOLN
100.0000 mL | Freq: Once | INTRAVENOUS | Status: AC | PRN
  Administered 2024-08-17: 75 mL via INTRAVENOUS

## 2024-08-17 MED ORDER — LACTATED RINGERS IV BOLUS
1000.0000 mL | Freq: Once | INTRAVENOUS | Status: AC
  Administered 2024-08-17: 1000 mL via INTRAVENOUS

## 2024-08-17 NOTE — Discharge Instructions (Addendum)
 Your workup was overall reassuring with expected changes on CT imaging of your thyroid  status post radioactive iodine ablation.  Your heart rate was elevated but improved with IV fluids and home Coreg .  Your TSH remains low, recommend you continue to follow-up with your endocrinologist outpatient and continue to take your outpatient methimazole.  Your CT to evaluate for blood clot in your lungs was normal and your cardiac enzymes were unremarkable.

## 2024-08-17 NOTE — ED Provider Notes (Signed)
 [facility] AT [facility] Provider Note   CSN: [id number] Arrival date & time: 08/17/24  1459     Patient presents with: Chest Pain   Rebecca Mccullough is a 53 y.o. female.    Chest Pain    53 year old female with medical history significant for Graves' disease status post radioactive iodine ablation on 8/1, not yet started Synthroid, still on methimazole, follows outpatient with endocrinology who presents to the emergency department with multiple complaints.  The patient was seen at her endocrinologist office earlier this morning.  Since Friday she has had right-sided chest pain that radiates to her right shoulder/scapula.  She endorses a dry cough, mild shortness of breath and pleuritic chest discomfort.  She endorses severe sharp stabbing chest pain when she tries to take a deep breath.  She denies any history of PE or DVT.  Had some cramping in her foot over the past few days.  She denies any fevers or chills.  She denies any ripping or tearing pain.  Pain is all right-sided.  Additionally, she states that she feels like there is a golf ball sized mass in her neck that is new.  She denies any difficulty swallowing or phonating or breathing.  She endorses pain on swallowing.  She went to her endocrinologist office today and her heart rate was in the 140s and she was advised to present to the emergency department for further evaluation.  Prior to Admission medications   Medication Sig Start Date End Date Taking? Authorizing Provider  ALPRAZolam (XANAX) 1 MG tablet Take 1 mg by mouth 2 (two) times daily as needed for anxiety or sleep.  03/31/15   [provider]  ARIPiprazole  (ABILIFY ) 15 MG tablet Take 15 mg by mouth daily. 02/10/21   [provider]  carvedilol  (COREG ) 12.5 MG tablet Take 12.5 mg by mouth 2 (two) times daily with a meal.    [provider]  cycloSPORINE (RESTASIS) 0.05 % ophthalmic emulsion Place 1 drop into both eyes 2  (two) times daily. 01/04/23   [provider]  desvenlafaxine (PRISTIQ) 100 MG 24 hr tablet Take 100 mg by mouth daily.    [provider]  eszopiclone (LUNESTA) 2 MG TABS tablet Take 2 mg by mouth at bedtime. 04/08/22   [provider]  hydrochlorothiazide  (HYDRODIURIL ) 12.5 MG tablet Take 12.5 mg by mouth daily. 05/08/23   [provider]  hydrochlorothiazide  (HYDRODIURIL ) 25 MG tablet Take by mouth. 05/27/19   [provider]  lisinopril (ZESTRIL) 20 MG tablet Take 20 mg by mouth daily.    [provider]  methimazole (TAPAZOLE) 5 MG tablet Take 5 mg by mouth 3 (three) times daily.    [provider]  metoprolol  succinate (TOPROL -XL) 100 MG 24 hr tablet Take 100 mg by mouth daily. Take with or immediately following a meal.    [provider]  zolpidem  (AMBIEN ) 10 MG tablet Take by mouth as needed. 02/10/21   [provider]    Allergies: Oxycodone , Methadone, Other, and Fentanyl    Review of Systems  Cardiovascular:  Positive for chest pain.  All other systems reviewed and are negative.   Updated Vital Signs BP 123/77   Pulse (!) 111   Temp 98.5 F (36.9 C) (Oral)   Resp (!) 25   Ht 5' 5 (1.651 m)   Wt 70.3 kg   SpO2 100%   BMI 25.79 kg/m   Physical Exam Vitals and nursing note reviewed.  Constitutional:      General: She is not in acute distress.    Appearance: She is well-developed. She is not ill-appearing or diaphoretic.  HENT:     Head: Normocephalic and atraumatic.  Eyes:     Conjunctiva/sclera: Conjunctivae normal.  Cardiovascular:     Rate and Rhythm: Regular rhythm. Tachycardia present.     Pulses: Normal pulses.     Heart sounds: No murmur heard.    Comments: Distal pulses symmetric Pulmonary:     Effort: Pulmonary effort is normal. No tachypnea or respiratory distress.     Breath sounds: Normal breath sounds.  Abdominal:     Palpations: Abdomen is soft.     Tenderness: There is  no abdominal tenderness.  Musculoskeletal:        General: No swelling.     Cervical back: Neck supple.     Right lower leg: No edema.     Left lower leg: No edema.  Skin:    General: Skin is warm and dry.     Capillary Refill: Capillary refill takes less than 2 seconds.  Neurological:     Mental Status: She is alert.  Psychiatric:        Mood and Affect: Mood normal.     (all labs ordered are listed, but only abnormal results are displayed) Labs Reviewed  CBC - Abnormal; Notable for the following components:      Result Value   Platelets 441 (*)    All other components within normal limits  TSH - Abnormal; Notable for the following components:   TSH <0.010 (*)    All other components within normal limits  BASIC METABOLIC PANEL WITH GFR  T4, FREE  TROPONIN T, HIGH SENSITIVITY  TROPONIN T, HIGH SENSITIVITY    EKG: EKG Interpretation Date/Time:  Monday August 17 2024 15:09:21 EDT Ventricular Rate:  139 PR Interval:  150 QRS Duration:  78 QT Interval:  264 QTC Calculation: 401 R Axis:   74  Text Interpretation: Sinus tachycardia Otherwise normal ECG When compared with ECG of 05-Apr-2015 13:29, PREVIOUS ECG IS PRESENT Confirmed by Jerrol Agent ([id number]) on 08/17/2024 3:16:02 PM  Radiology: CT Soft Tissue Neck W Contrast Result Date: 08/17/2024 EXAM: CT NECK WITH CONTRAST 08/17/2024 94:91:75 PM TECHNIQUE: CT of the neck was performed with the administration of intravenous contrast. Multiplanar reformatted images are provided for review. Automated exposure control, iterative reconstruction, and/or weight based adjustment of the mA/kV was utilized to reduce the radiation dose to as low as reasonably achievable. COMPARISON: None available. CLINICAL HISTORY: Patient with history of Graves disease and thyroid  ablation presenting with right-sided chest pain, shoulder pain, and sensation of a golf ball in the throat, painful to swallow. Slight shortness of breath, airway intact.  Question neck mass. FINDINGS: AERODIGESTIVE TRACT: No discrete mass. No edema. SALIVARY GLANDS: The parotid and submandibular glands are unremarkable. THYROID : Diffusely heterogeneous appearance of the thyroid  without significant generalized enlargement, a dominant mass, surrounding inflammation, or regional mass effect. LYMPH NODES: No suspicious cervical lymphadenopathy. SOFT TISSUES: No mass or fluid collection. BRAIN, ORBITS, SINUSES AND MASTOIDS: No acute abnormality. LUNGS AND MEDIASTINUM: No acute abnormality. BONES: Mild cervical spondylosis. No suspicious bone lesion. IMPRESSION: 1. No neck mass identified. 2. Heterogeneous thyroid  which may be related to recent radioactive iodine ablation. Electronically signed by: Dasie Hamburg MD 08/17/2024 05:49 PM EDT RP Workstation: [id number]   CT Angio Chest PE W and/or Wo Contrast Result Date: 08/17/2024 CLINICAL DATA:  Pulmonary embolism (PE) suspected, high prob.  Right-sided chest pain. EXAM: CT ANGIOGRAPHY CHEST WITH CONTRAST TECHNIQUE: Multidetector CT imaging of the chest was performed using the standard protocol during bolus administration of intravenous contrast. Multiplanar CT image reconstructions and MIPs were obtained to evaluate the vascular anatomy. RADIATION DOSE REDUCTION: This exam was performed according to the departmental dose-optimization program which includes automated exposure control, adjustment of the mA and/or kV according to patient size and/or use of iterative reconstruction technique. CONTRAST:  75mL OMNIPAQUE  IOHEXOL  350 MG/ML SOLN COMPARISON:  CT angiography chest from 03/17/2013. FINDINGS: Cardiovascular: No evidence of embolism to the proximal subsegmental pulmonary artery level. Normal cardiac size. No pericardial effusion. No aortic aneurysm. Mediastinum/Nodes: Visualized thyroid  gland appears grossly unremarkable. No solid / cystic mediastinal masses. The esophagus is nondistended precluding optimal assessment. No axillary,  mediastinal or hilar lymphadenopathy by size criteria. Lungs/Pleura: The central tracheo-bronchial tree is patent. There are dependent changes in bilateral lungs. No mass or consolidation. No pleural effusion or pneumothorax. No suspicious lung nodules. Upper Abdomen: Visualized upper abdominal viscera within normal limits. There is a small sliding hiatal hernia. Musculoskeletal: The visualized soft tissues of the chest wall are grossly unremarkable. No suspicious osseous lesions. There are mild multilevel degenerative changes in the visualized spine. Review of the MIP images confirms the above findings. IMPRESSION: 1. No embolism to the proximal subsegmental pulmonary artery level. No lung mass, consolidation, pleural effusion or pneumothorax. 2. Other nonacute observations, as described above. Electronically Signed   By: Ree Molt M.D.   On: 08/17/2024 17:29     Procedures   Medications Ordered in the ED  lactated ringers  bolus 1,000 mL (0 mLs Intravenous Stopped 08/17/24 1653)  iohexol  (OMNIPAQUE ) 350 MG/ML injection 100 mL (75 mLs Intravenous Contrast Given 08/17/24 1654)  carvedilol  (COREG ) tablet 25 mg (25 mg Oral Given 08/17/24 2004)                                    Medical Decision Making Amount and/or Complexity of Data Reviewed Labs: ordered. Radiology: ordered.  Risk Prescription drug management.    53 year old female with medical history significant for Graves' disease status post radioactive iodine ablation on 8/1, not yet started Synthroid, still on methimazole, follows outpatient with endocrinology who presents to the emergency department with multiple complaints.  The patient was seen at her endocrinologist office earlier this morning.  Since Friday she has had right-sided chest pain that radiates to her right shoulder/scapula.  She endorses a dry cough, mild shortness of breath and pleuritic chest discomfort.  She endorses severe sharp stabbing chest pain when she tries  to take a deep breath.  She denies any history of PE or DVT.  Had some cramping in her foot over the past few days.  She denies any fevers or chills.  She denies any ripping or tearing pain.  Pain is all right-sided.  Additionally, she states that she feels like there is a golf ball sized mass in her neck that is new.  She denies any difficulty swallowing or phonating or breathing.  She endorses pain on swallowing.  She went to her endocrinologist office today and her heart rate was in the 140s and she was advised to present to the emergency department for further evaluation.  On arrival, the patient was afebrile, tachycardic heart rate 132, not tachypneic RR 18, BP 145/112, saturating 100% on room air.  Patient presenting with chest pain.  Endorses right-sided pain  radiating into her right shoulder.  Endorses feeling of a golf ball in her throat.  She is status post thyroid  ablation on 8/1.  Endorses difficulty swallowing.  Was advised to present to the emergency department by her endocrinologist due to heart rates in the 140s.  Some concern for PE.  Will obtain CT imaging of the chest to further evaluate, will also obtain cardiac troponins.  Will obtain CT imaging of the neck.  IV access was obtained, the patient was administered an IV fluid bolus.  Initial EKG revealed sinus tachycardia, ventricular rate 139, no acute ischemic changes.  Labs: Troponins x 2 negative, TSH low, free T4 pending, CBC without a leukocytosis or anemia, BMP unremarkable.  Patient has no abdominal tenderness, low concern for cholecystitis.  Pain is not ripping or tearing and the patient has symmetric pulses bilaterally, low concern for aortic dissection.  CT neck: IMPRESSION:  1. No neck mass identified.  2. Heterogeneous thyroid  which may be related to recent radioactive iodine  ablation.    CTA PE: IMPRESSION:  1. No embolism to the proximal subsegmental pulmonary artery level.  No lung mass, consolidation, pleural  effusion or pneumothorax.  2. Other nonacute observations, as described above.    On repeat evaluation, the patient was informed of the negative findings on CT imaging.  Her heart rate had improved to the low 100s.  She states that she has not yet taken her home Coreg .  This was administered.  Low concern for thyrotoxicosis status post radioactive iodine ablation, the patient is afebrile, not hypotensive.  Prior to final reassessment to evaluate the patient for discharge, I was notified the patient had subsequently eloped from the emergency department.  I was unable to fully evaluate the patient to clear her for discharge prior to her elopement.     Final diagnoses:  Chest pain, unspecified type  Tachycardia    ED Discharge Orders     None          Jerrol Agent, MD 08/17/24 2046

## 2024-08-17 NOTE — ED Notes (Signed)
 Patient not in room  Gown on bed

## 2024-08-17 NOTE — ED Triage Notes (Signed)
 Pt POV, hx Graves disease, thyroid  ablation 8/1, now experiencing R side chest pain radiating into R shoulder that began Friday and the feeling of a golf ball in her throat yesterday, painful to swallow. Seen by endocrinologist today, HR 140s, advised to come to ED for evaluation. Slight SOB, airway intact.

## 2024-09-01 DIAGNOSIS — E0590 Thyrotoxicosis, unspecified without thyrotoxic crisis or storm: Secondary | ICD-10-CM | POA: Diagnosis not present

## 2024-10-08 DIAGNOSIS — E0500 Thyrotoxicosis with diffuse goiter without thyrotoxic crisis or storm: Secondary | ICD-10-CM | POA: Diagnosis not present

## 2024-10-08 DIAGNOSIS — H16223 Keratoconjunctivitis sicca, not specified as Sjogren's, bilateral: Secondary | ICD-10-CM | POA: Diagnosis not present

## 2024-10-08 DIAGNOSIS — H0100B Unspecified blepharitis left eye, upper and lower eyelids: Secondary | ICD-10-CM | POA: Diagnosis not present

## 2024-10-08 DIAGNOSIS — H0100A Unspecified blepharitis right eye, upper and lower eyelids: Secondary | ICD-10-CM | POA: Diagnosis not present

## 2024-10-20 DIAGNOSIS — Z13220 Encounter for screening for lipoid disorders: Secondary | ICD-10-CM | POA: Diagnosis not present

## 2024-10-20 DIAGNOSIS — E0590 Thyrotoxicosis, unspecified without thyrotoxic crisis or storm: Secondary | ICD-10-CM | POA: Diagnosis not present

## 2024-10-20 DIAGNOSIS — D649 Anemia, unspecified: Secondary | ICD-10-CM | POA: Diagnosis not present

## 2024-10-20 DIAGNOSIS — Z1331 Encounter for screening for depression: Secondary | ICD-10-CM | POA: Diagnosis not present

## 2024-10-20 DIAGNOSIS — F411 Generalized anxiety disorder: Secondary | ICD-10-CM | POA: Diagnosis not present

## 2024-10-20 DIAGNOSIS — E559 Vitamin D deficiency, unspecified: Secondary | ICD-10-CM | POA: Diagnosis not present

## 2024-10-20 DIAGNOSIS — I10 Essential (primary) hypertension: Secondary | ICD-10-CM | POA: Diagnosis not present

## 2024-10-20 DIAGNOSIS — Z0000 Encounter for general adult medical examination without abnormal findings: Secondary | ICD-10-CM | POA: Diagnosis not present

## 2024-10-20 DIAGNOSIS — F5101 Primary insomnia: Secondary | ICD-10-CM | POA: Diagnosis not present

## 2024-10-20 DIAGNOSIS — I479 Paroxysmal tachycardia, unspecified: Secondary | ICD-10-CM | POA: Diagnosis not present

## 2024-10-20 DIAGNOSIS — E0500 Thyrotoxicosis with diffuse goiter without thyrotoxic crisis or storm: Secondary | ICD-10-CM | POA: Diagnosis not present

## 2024-10-26 DIAGNOSIS — E890 Postprocedural hypothyroidism: Secondary | ICD-10-CM | POA: Diagnosis not present

## 2024-11-09 DIAGNOSIS — R609 Edema, unspecified: Secondary | ICD-10-CM | POA: Diagnosis not present

## 2024-11-25 DIAGNOSIS — E890 Postprocedural hypothyroidism: Secondary | ICD-10-CM | POA: Diagnosis not present

## 2024-11-25 DIAGNOSIS — R350 Frequency of micturition: Secondary | ICD-10-CM | POA: Diagnosis not present

## 2024-11-25 DIAGNOSIS — E0500 Thyrotoxicosis with diffuse goiter without thyrotoxic crisis or storm: Secondary | ICD-10-CM | POA: Diagnosis not present

## 2024-11-25 DIAGNOSIS — R300 Dysuria: Secondary | ICD-10-CM | POA: Diagnosis not present

## 2024-12-03 DIAGNOSIS — M47816 Spondylosis without myelopathy or radiculopathy, lumbar region: Secondary | ICD-10-CM | POA: Diagnosis not present

## 2024-12-15 DIAGNOSIS — M47814 Spondylosis without myelopathy or radiculopathy, thoracic region: Secondary | ICD-10-CM | POA: Diagnosis not present

## 2024-12-18 DIAGNOSIS — E559 Vitamin D deficiency, unspecified: Secondary | ICD-10-CM | POA: Diagnosis not present

## 2024-12-18 DIAGNOSIS — Z780 Asymptomatic menopausal state: Secondary | ICD-10-CM | POA: Diagnosis not present

## 2024-12-18 DIAGNOSIS — N959 Unspecified menopausal and perimenopausal disorder: Secondary | ICD-10-CM | POA: Diagnosis not present
